# Patient Record
Sex: Male | Born: 1950 | Race: White | Hispanic: No | Marital: Married | Smoking: Never smoker
Health system: Southern US, Community
[De-identification: ages and names within clinical notes are randomized; demographics above are authoritative.]

## PROBLEM LIST (undated history)

## (undated) DIAGNOSIS — I10 Essential (primary) hypertension: Secondary | ICD-10-CM

## (undated) DIAGNOSIS — C73 Malignant neoplasm of thyroid gland: Secondary | ICD-10-CM

## (undated) DIAGNOSIS — K56609 Unspecified intestinal obstruction, unspecified as to partial versus complete obstruction: Secondary | ICD-10-CM

## (undated) DIAGNOSIS — E119 Type 2 diabetes mellitus without complications: Secondary | ICD-10-CM

## (undated) DIAGNOSIS — E7800 Pure hypercholesterolemia, unspecified: Secondary | ICD-10-CM

## (undated) DIAGNOSIS — Z87442 Personal history of urinary calculi: Secondary | ICD-10-CM

## (undated) DIAGNOSIS — E079 Disorder of thyroid, unspecified: Secondary | ICD-10-CM

## (undated) DIAGNOSIS — G4730 Sleep apnea, unspecified: Secondary | ICD-10-CM

## (undated) HISTORY — PX: THYROIDECTOMY: SHX17

## (undated) HISTORY — PX: LAPAROSCOPIC GASTRIC SLEEVE RESECTION: SHX5895

## (undated) HISTORY — DX: Type 2 diabetes mellitus without complications: E11.9

## (undated) HISTORY — PX: ABDOMINAL SURGERY: SHX537

## (undated) HISTORY — DX: Essential (primary) hypertension: I10

## (undated) HISTORY — PX: HERNIA REPAIR: SHX51

## (undated) HISTORY — DX: Disorder of thyroid, unspecified: E07.9

## (undated) HISTORY — DX: Pure hypercholesterolemia, unspecified: E78.00

## (undated) HISTORY — PX: COLON SURGERY: SHX602

---

## 1998-03-05 ENCOUNTER — Ambulatory Visit (HOSPITAL_COMMUNITY): Admission: RE | Admit: 1998-03-05 | Discharge: 1998-03-05 | Payer: Self-pay | Admitting: Internal Medicine

## 1999-04-24 ENCOUNTER — Inpatient Hospital Stay (HOSPITAL_COMMUNITY): Admission: EM | Admit: 1999-04-24 | Discharge: 1999-05-03 | Payer: Self-pay | Admitting: General Surgery

## 1999-04-30 ENCOUNTER — Encounter: Payer: Self-pay | Admitting: General Surgery

## 2000-01-19 ENCOUNTER — Ambulatory Visit (HOSPITAL_BASED_OUTPATIENT_CLINIC_OR_DEPARTMENT_OTHER): Admission: RE | Admit: 2000-01-19 | Discharge: 2000-01-19 | Payer: Self-pay | Admitting: General Surgery

## 2001-07-13 ENCOUNTER — Encounter: Payer: Self-pay | Admitting: Internal Medicine

## 2001-07-13 ENCOUNTER — Encounter: Admission: RE | Admit: 2001-07-13 | Discharge: 2001-07-13 | Payer: Self-pay | Admitting: Internal Medicine

## 2001-07-19 ENCOUNTER — Other Ambulatory Visit: Admission: RE | Admit: 2001-07-19 | Discharge: 2001-07-19 | Payer: Self-pay | Admitting: *Deleted

## 2001-09-01 ENCOUNTER — Encounter (INDEPENDENT_AMBULATORY_CARE_PROVIDER_SITE_OTHER): Payer: Self-pay | Admitting: *Deleted

## 2001-09-01 ENCOUNTER — Ambulatory Visit (HOSPITAL_BASED_OUTPATIENT_CLINIC_OR_DEPARTMENT_OTHER): Admission: RE | Admit: 2001-09-01 | Discharge: 2001-09-01 | Payer: Self-pay | Admitting: *Deleted

## 2001-09-11 ENCOUNTER — Encounter: Payer: Self-pay | Admitting: Otolaryngology

## 2001-09-13 ENCOUNTER — Encounter (INDEPENDENT_AMBULATORY_CARE_PROVIDER_SITE_OTHER): Payer: Self-pay | Admitting: Specialist

## 2001-09-13 ENCOUNTER — Inpatient Hospital Stay (HOSPITAL_COMMUNITY): Admission: RE | Admit: 2001-09-13 | Discharge: 2001-09-17 | Payer: Self-pay | Admitting: Otolaryngology

## 2001-10-04 ENCOUNTER — Encounter: Payer: Self-pay | Admitting: Hematology and Oncology

## 2001-10-04 ENCOUNTER — Ambulatory Visit (HOSPITAL_COMMUNITY): Admission: RE | Admit: 2001-10-04 | Discharge: 2001-10-04 | Payer: Self-pay | Admitting: Hematology and Oncology

## 2001-11-20 ENCOUNTER — Ambulatory Visit (HOSPITAL_COMMUNITY): Admission: RE | Admit: 2001-11-20 | Discharge: 2001-11-20 | Payer: Self-pay | Admitting: Endocrinology

## 2001-11-24 ENCOUNTER — Ambulatory Visit (HOSPITAL_COMMUNITY): Admission: RE | Admit: 2001-11-24 | Discharge: 2001-11-24 | Payer: Self-pay | Admitting: Endocrinology

## 2001-11-24 ENCOUNTER — Encounter: Payer: Self-pay | Admitting: Endocrinology

## 2001-12-21 ENCOUNTER — Encounter: Payer: Self-pay | Admitting: Endocrinology

## 2001-12-21 ENCOUNTER — Ambulatory Visit (HOSPITAL_COMMUNITY): Admission: RE | Admit: 2001-12-21 | Discharge: 2001-12-21 | Payer: Self-pay | Admitting: Endocrinology

## 2002-05-02 ENCOUNTER — Encounter: Payer: Self-pay | Admitting: Family Medicine

## 2002-05-02 ENCOUNTER — Encounter: Admission: RE | Admit: 2002-05-02 | Discharge: 2002-05-02 | Payer: Self-pay | Admitting: Family Medicine

## 2002-09-03 ENCOUNTER — Encounter (HOSPITAL_COMMUNITY): Admission: RE | Admit: 2002-09-03 | Discharge: 2002-12-02 | Payer: Self-pay | Admitting: Endocrinology

## 2002-09-07 ENCOUNTER — Encounter: Payer: Self-pay | Admitting: Endocrinology

## 2002-10-17 ENCOUNTER — Encounter: Payer: Self-pay | Admitting: Endocrinology

## 2003-02-12 ENCOUNTER — Encounter: Admission: RE | Admit: 2003-02-12 | Discharge: 2003-05-13 | Payer: Self-pay | Admitting: Endocrinology

## 2004-03-06 ENCOUNTER — Observation Stay (HOSPITAL_COMMUNITY): Admission: RE | Admit: 2004-03-06 | Discharge: 2004-03-07 | Payer: Self-pay | Admitting: General Surgery

## 2009-06-30 ENCOUNTER — Encounter (HOSPITAL_COMMUNITY): Admission: RE | Admit: 2009-06-30 | Discharge: 2009-08-14 | Payer: Self-pay | Admitting: Endocrinology

## 2009-07-08 ENCOUNTER — Encounter: Admission: RE | Admit: 2009-07-08 | Discharge: 2009-09-29 | Payer: Self-pay | Admitting: General Surgery

## 2009-07-08 ENCOUNTER — Ambulatory Visit (HOSPITAL_COMMUNITY): Admission: RE | Admit: 2009-07-08 | Discharge: 2009-07-08 | Payer: Self-pay | Admitting: General Surgery

## 2009-07-09 ENCOUNTER — Ambulatory Visit (HOSPITAL_COMMUNITY): Admission: RE | Admit: 2009-07-09 | Discharge: 2009-07-09 | Payer: Self-pay | Admitting: General Surgery

## 2009-07-16 ENCOUNTER — Ambulatory Visit (HOSPITAL_COMMUNITY): Admission: RE | Admit: 2009-07-16 | Discharge: 2009-07-16 | Payer: Self-pay | Admitting: General Surgery

## 2009-10-20 ENCOUNTER — Inpatient Hospital Stay (HOSPITAL_COMMUNITY): Admission: RE | Admit: 2009-10-20 | Discharge: 2009-10-21 | Payer: Self-pay | Admitting: General Surgery

## 2009-10-25 ENCOUNTER — Inpatient Hospital Stay (HOSPITAL_COMMUNITY): Admission: EM | Admit: 2009-10-25 | Discharge: 2009-10-26 | Payer: Self-pay | Admitting: Emergency Medicine

## 2009-11-17 ENCOUNTER — Ambulatory Visit (HOSPITAL_COMMUNITY): Admission: RE | Admit: 2009-11-17 | Discharge: 2009-11-17 | Payer: Self-pay | Admitting: General Surgery

## 2010-04-29 ENCOUNTER — Ambulatory Visit (HOSPITAL_COMMUNITY): Admission: RE | Admit: 2010-04-29 | Discharge: 2010-04-29 | Payer: Self-pay | Admitting: Advanced Practice Midwife

## 2010-04-29 ENCOUNTER — Encounter: Admission: RE | Admit: 2010-04-29 | Discharge: 2010-04-29 | Payer: Self-pay | Admitting: Family Medicine

## 2010-05-01 ENCOUNTER — Inpatient Hospital Stay (HOSPITAL_COMMUNITY): Admission: EM | Admit: 2010-05-01 | Discharge: 2010-05-04 | Payer: Self-pay | Admitting: Emergency Medicine

## 2010-12-07 ENCOUNTER — Encounter: Payer: Self-pay | Admitting: General Surgery

## 2011-02-01 LAB — CBC
HCT: 36 % — ABNORMAL LOW (ref 39.0–52.0)
HCT: 39.4 % (ref 39.0–52.0)
HCT: 43.4 % (ref 39.0–52.0)
HCT: 43.8 % (ref 39.0–52.0)
Hemoglobin: 12.6 g/dL — ABNORMAL LOW (ref 13.0–17.0)
Hemoglobin: 13.4 g/dL (ref 13.0–17.0)
Hemoglobin: 14.7 g/dL (ref 13.0–17.0)
MCHC: 33.6 g/dL (ref 30.0–36.0)
MCHC: 33.8 g/dL (ref 30.0–36.0)
MCHC: 33.9 g/dL (ref 30.0–36.0)
MCHC: 34.3 g/dL (ref 30.0–36.0)
MCV: 89.1 fL (ref 78.0–100.0)
MCV: 89.7 fL (ref 78.0–100.0)
MCV: 90.2 fL (ref 78.0–100.0)
Platelets: 193 10*3/uL (ref 150–400)
Platelets: 200 10*3/uL (ref 150–400)
Platelets: 236 10*3/uL (ref 150–400)
Platelets: 263 10*3/uL (ref 150–400)
RBC: 4.01 MIL/uL — ABNORMAL LOW (ref 4.22–5.81)
RBC: 4.81 MIL/uL (ref 4.22–5.81)
RDW: 14.3 % (ref 11.5–15.5)
WBC: 12.1 10*3/uL — ABNORMAL HIGH (ref 4.0–10.5)
WBC: 15.2 10*3/uL — ABNORMAL HIGH (ref 4.0–10.5)

## 2011-02-01 LAB — COMPREHENSIVE METABOLIC PANEL
BUN: 13 mg/dL (ref 6–23)
Calcium: 9.2 mg/dL (ref 8.4–10.5)
Creatinine, Ser: 1.05 mg/dL (ref 0.4–1.5)
Glucose, Bld: 148 mg/dL — ABNORMAL HIGH (ref 70–99)
Total Protein: 7.6 g/dL (ref 6.0–8.3)

## 2011-02-01 LAB — GLUCOSE, CAPILLARY
Glucose-Capillary: 115 mg/dL — ABNORMAL HIGH (ref 70–99)
Glucose-Capillary: 119 mg/dL — ABNORMAL HIGH (ref 70–99)
Glucose-Capillary: 126 mg/dL — ABNORMAL HIGH (ref 70–99)
Glucose-Capillary: 139 mg/dL — ABNORMAL HIGH (ref 70–99)
Glucose-Capillary: 143 mg/dL — ABNORMAL HIGH (ref 70–99)
Glucose-Capillary: 149 mg/dL — ABNORMAL HIGH (ref 70–99)
Glucose-Capillary: 184 mg/dL — ABNORMAL HIGH (ref 70–99)
Glucose-Capillary: 201 mg/dL — ABNORMAL HIGH (ref 70–99)
Glucose-Capillary: 92 mg/dL (ref 70–99)
Glucose-Capillary: 93 mg/dL (ref 70–99)

## 2011-02-01 LAB — PROCALCITONIN

## 2011-02-01 LAB — BASIC METABOLIC PANEL
BUN: 9 mg/dL (ref 6–23)
CO2: 28 mEq/L (ref 19–32)
Calcium: 8.6 mg/dL (ref 8.4–10.5)
Creatinine, Ser: 0.99 mg/dL (ref 0.4–1.5)
GFR calc non Af Amer: >60 mL/min (ref >60)
GFR calc non Af Amer: >60 mL/min (ref >60)
Glucose, Bld: 81 mg/dL (ref 70–99)
Potassium: 3.5 mEq/L (ref 3.5–5.1)
Sodium: 137 mEq/L (ref 135–145)
Sodium: 138 mEq/L (ref 135–145)

## 2011-02-01 LAB — DIFFERENTIAL
Basophils Absolute: 0.2 10*3/uL — ABNORMAL HIGH (ref 0.0–0.1)
Eosinophils Relative: 0 % (ref 0–5)
Eosinophils Relative: 2 % (ref 0–5)
Lymphocytes Relative: 10 % — ABNORMAL LOW (ref 12–46)
Lymphocytes Relative: 13 % (ref 12–46)
Lymphocytes Relative: 7 % — ABNORMAL LOW (ref 12–46)
Lymphs Abs: 1.1 10*3/uL (ref 0.7–4.0)
Lymphs Abs: 1.4 10*3/uL (ref 0.7–4.0)
Lymphs Abs: 1.6 10*3/uL (ref 0.7–4.0)
Monocytes Absolute: 1.1 10*3/uL — ABNORMAL HIGH (ref 0.1–1.0)
Neutro Abs: 9.2 10*3/uL — ABNORMAL HIGH (ref 1.7–7.7)
Neutrophils Relative %: 82 % — ABNORMAL HIGH (ref 43–77)

## 2011-02-01 LAB — HEMOGLOBIN A1C: Hgb A1c MFr Bld: 7.3 % — ABNORMAL HIGH (ref <5.7)

## 2011-02-01 LAB — B. BURGDORFI ANTIBODIES

## 2011-02-01 LAB — CULTURE, BLOOD (ROUTINE X 2): Culture: NO GROWTH

## 2011-02-01 LAB — LIPID PANEL
Total CHOL/HDL Ratio: 4.5 RATIO
Triglycerides: 152 mg/dL — ABNORMAL HIGH (ref <150)
VLDL: 30 mg/dL (ref 0–40)

## 2011-02-01 LAB — ROCKY MTN SPOTTED FVR AB, IGM-BLOOD

## 2011-02-16 LAB — COMPREHENSIVE METABOLIC PANEL
ALT: 44 U/L (ref 0–53)
Albumin: 3.7 g/dL (ref 3.5–5.2)
Alkaline Phosphatase: 83 U/L (ref 39–117)
BUN: 18 mg/dL (ref 6–23)
Chloride: 99 mEq/L (ref 96–112)
Glucose, Bld: 200 mg/dL — ABNORMAL HIGH (ref 70–99)
Potassium: 5 mEq/L (ref 3.5–5.1)
Sodium: 135 mEq/L (ref 135–145)
Total Bilirubin: 1 mg/dL (ref 0.3–1.2)
Total Protein: 7.4 g/dL (ref 6.0–8.3)

## 2011-02-16 LAB — DIFFERENTIAL
Basophils Absolute: 0.1 10*3/uL (ref 0.0–0.1)
Basophils Relative: 1 % (ref 0–1)
Eosinophils Absolute: 0.4 10*3/uL (ref 0.0–0.7)
Monocytes Absolute: 1.1 10*3/uL — ABNORMAL HIGH (ref 0.1–1.0)
Monocytes Relative: 9 % (ref 3–12)
Neutro Abs: 8.3 10*3/uL — ABNORMAL HIGH (ref 1.7–7.7)
Neutrophils Relative %: 68 % (ref 43–77)

## 2011-02-16 LAB — CBC
HCT: 41.1 % (ref 39.0–52.0)
HCT: 46.7 % (ref 39.0–52.0)
Hemoglobin: 15.9 g/dL (ref 13.0–17.0)
Platelets: 291 10*3/uL (ref 150–400)
RBC: 5.21 MIL/uL (ref 4.22–5.81)
RDW: 13.2 % (ref 11.5–15.5)
RDW: 13.3 % (ref 11.5–15.5)
WBC: 12.3 10*3/uL — ABNORMAL HIGH (ref 4.0–10.5)

## 2011-02-16 LAB — GLUCOSE, CAPILLARY
Glucose-Capillary: 147 mg/dL — ABNORMAL HIGH (ref 70–99)
Glucose-Capillary: 169 mg/dL — ABNORMAL HIGH (ref 70–99)
Glucose-Capillary: 185 mg/dL — ABNORMAL HIGH (ref 70–99)

## 2011-02-16 LAB — BASIC METABOLIC PANEL
BUN: 16 mg/dL (ref 6–23)
Creatinine, Ser: 1.17 mg/dL (ref 0.4–1.5)
GFR calc non Af Amer: >60 mL/min (ref >60)
Glucose, Bld: 148 mg/dL — ABNORMAL HIGH (ref 70–99)
Potassium: 3.8 mEq/L (ref 3.5–5.1)

## 2011-02-17 LAB — CBC
HCT: 42.5 % (ref 39.0–52.0)
MCHC: 33.7 g/dL (ref 30.0–36.0)
MCV: 88.9 fL (ref 78.0–100.0)
Platelets: 273 10*3/uL (ref 150–400)
RBC: 4.77 MIL/uL (ref 4.22–5.81)
WBC: 6.1 10*3/uL (ref 4.0–10.5)

## 2011-02-17 LAB — COMPREHENSIVE METABOLIC PANEL
BUN: 14 mg/dL (ref 6–23)
CO2: 27 mEq/L (ref 19–32)
Chloride: 106 mEq/L (ref 96–112)
Creatinine, Ser: 0.96 mg/dL (ref 0.4–1.5)
GFR calc non Af Amer: >60 mL/min (ref >60)
Total Bilirubin: 0.5 mg/dL (ref 0.3–1.2)

## 2011-02-17 LAB — DIFFERENTIAL
Basophils Relative: 0 % (ref 0–1)
Eosinophils Absolute: 0.4 10*3/uL (ref 0.0–0.7)
Eosinophils Relative: 6 % — ABNORMAL HIGH (ref 0–5)
Lymphocytes Relative: 42 % (ref 12–46)
Monocytes Absolute: 0.4 10*3/uL (ref 0.1–1.0)
Neutro Abs: 2.8 10*3/uL (ref 1.7–7.7)

## 2011-04-02 NOTE — Op Note (Signed)
Dylan Reyes                          ACCOUNT NO.:  [id number]   MEDICAL RECORD NO.:  [id number]                   PATIENT TYPE:  AMB   LOCATION:  DAY                                  FACILITY:  [facility]   PHYSICIAN:  Anselm Pancoast. Zachery Dakins, M.D.          DATE OF BIRTH:  09-06-1951   DATE OF PROCEDURE:  03/06/2004  DATE OF DISCHARGE:                                 OPERATIVE REPORT   PREOPERATIVE DIAGNOSIS:  Recurrent incisional hernia.   OPERATION:  Repair of incisional hernia with mesh, a piece of Prolene 6 x 6  inches.   General anesthesia.  Open technique.   SURGEON:  Anselm Pancoast. Zachery Dakins, M.D.   ASSISTANT:  Nurse.   HISTORY:  Dylan Reyes is a 60 year old approximately 380 pound male who had  intestinal obstruction approximately 5-6 years ago and developed an  incisional hernia around the mid portion of his abdomen that I repaired.  About two years after surgery, I saw him back when he was getting a little  weakness superior to that but noted that he had a mass in his left thyroid  that was referred to the EMT people and ultimately to [facility] because of a  carcinoma of the left thyroid.  This recurred and was reoperated at [facility].  He appears to be adequately managed and suppressed at this time.  His  incisional hernia is kind of getting gradually larger.  Returned to me for  repair of that.  I recommended that we repair this with general anesthesia.  I hoped that I was going to put in a small piece of mesh under the fascia  sort of above where he has  the previous mesh, and preoperatively, he was  given 2 gm of Kefzol.   After induction of general anesthesia via the endotracheal tube, I marked  where the actual defect was and then sharp dissection down through the skin  and subcutaneous tissue, the top part of the old midline incision, and the  hernia sac was identified.  It appeared that the mesh that had been placed,  a 3 x 6 piece, kind of just above the umbilicus and  below.  It basically  separated its superior left side, and he had a hernia that was kind of  dissecting up over the mesh in one area and then above where the mesh  stopped in the other one.  I dissected the hernia sac down.  Fortunately, he  has a very generous omentum with no actual small bowel or anything up in the  area.  I freed this up circumferentially and elected to take about the top  half of the mesh where it had kind of rolled up a little bit out and then  freed everything circumferentially so a piece of 6 x 6 Prolene mesh could be  placed.  This was anchored with 4-0 sutures of 0 Prolene twice through the  abdominal wall  through stab incisions.  Then there are numerous stitches  taken deep in the posterior fascia muscle with interrupted 2-0 Prolene,  approximately 4-5 stitches on each side in addition to the corner stitch.  Then with this reclosed, I then repaired the actual muscle over it in a  regular 0 Prolene interrupted suture.  The incision was about 3 inches.  I  did place about 20 cc of Marcaine 0.5% with Adrenaline for immediate  postoperative pain.  The subcutaneous tissue was then kind of freed up so I  could close this with 3-0 Vicryl, and then the skin itself was closed with  staples.  Two abdominal binders were put together to go around him were  used, with him coughing, and everything was lying flat and he heard his  stitches pop.  Then after he was extubated, he quieted on down.  Hopefully,  we will be able to not have to put an NG tube or even a Foley and let  him be up on the side of the bed.  He will be on the CPAP that he uses at  night and kind of limit the liquids today and hopefully he can go with a  regular diet tomorrow.  He will be kept at least overnight, according to how  he is feeling.  Diet advanced accordingly.                                               Anselm Pancoast. Zachery Dakins, M.D.    WJW/MEDQ  D:  03/06/2004  T:  03/06/2004  Job:  [id number]

## 2011-04-02 NOTE — Discharge Summary (Signed)
[place]. [facility]  Patient:    Dylan Reyes, Dylan Reyes Visit Number: [id number] MRN: [id number]          Service Type: MED Location: [phone] Attending Physician:  Susy Frizzle Admit Date:  09/13/2001 Discharge Date: 09/17/2001   CC:         Luanna Cole. Lenord Fellers, M.D.  Anselm Pancoast. Zachery Dakins, M.D.   Discharge Summary  ADMISSION DIAGNOSIS:  Metastatic thyroid carcinoma.  DISCHARGE DIAGNOSIS:  Metastatic thyroid carcinoma, status post total thyroidectomy and right modified neck dissection.  SERVICE:  ENT  PRIMARY CARE PHYSICIAN:  Dr. Luanna Cole. [place].  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  He underwent these procedures without difficulty.  He was transferred to the intensive care unit for the initial postoperative 24 hours.   He did very well with a drain in place.  He was on a regular diet and was transferred to the general nursing floor on postoperative day two because there was a shortage of general beds.  His calcium maintained a normal value during hospitalization. Drain was removed on postoperative day four prior to discharge.  There were no signs of swelling, hematoma, difficulty swallowing, difficulty with speech or with breathing.  He was discharged to home in good condition; instructed to take Synthroid daily.  Follow up with me on Tuesday for staple removal and we will make arrangements for radioactive iodine thyroid scan and potential treatment on his follow up as an outpatient. Attending Physician:  Susy Frizzle DD:  09/17/01 TD:  09/18/01 Job: [id number]

## 2011-04-02 NOTE — Op Note (Signed)
[place]. [facility]  Patient:    DRAYLON, MERCADEL Visit Number: [id number] MRN: [id number]          Service Type: DSU Location: [facility] Attending Physician:  Aundria Mems Dictated by:   Kathy Breach, M.D. Proc. 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  OPERATIVE PROCEDURE:  Excision midline bilobular cervical cyst with isthmus tumor.  POSTOPERATIVE DIAGNOSIS:  Frozen section returns papillary carcinoma in the thyroid portion of the specimen.  DESCRIPTION OF PROCEDURE:  With the patient under general endotracheal anesthesia, the neck, upper chest, and lower face was prepped and draped in a sterile fashion.  The patient had readily palpable 4- to 5-cm, just to the left of the midline, firm mass previously aspirated as a cyst overlying the left thyroid alar area.  Had a secondary firm palpable midline mass at the cricoid cartilage level.  A 4- to 5-inch horizontal incision in skin creases was made just below the cricoid cartilage level at the lower margin of the palpable mass.  An incision was made through subcutaneous fat and medial margins platysmal muscle down onto the strap muscles.  The strap muscles were split vertically in the midline encountering mass overlying the cricoid area and a cystic extension about 1.5- x 2-cm inferiorly down the midline over the upper trachea.  Also present was a large 4- to 5-cm cystic mass extending to the left side of the midline overlying the thyroid alar area.  With strap muscles elevated, the soft tissues were dissected off of the superficial aspect of the bilobular and mid portion solid tumor mass.  The ______ mass dissected fairly readily being careful  around the superior margin and no extending tract was identified.  We dissected down to the thyroid isthmus area basically overlying the cricoid so assume perhaps above the isthmus of the true gland.  The cystic extension inferiorly over the midline trachea was elevated.  Dissection on the right side and the undersurface very fibrotic and sticky as was the dissection around the lobular firm solid mass in the isthmus area.  There seemed to be no direct connection to the right thyroid lobe. There was induration and firm irregular tissue in the margin of the right strap muscle.  The lesion definitely confluent with the medial aspect of the left thyroid lobe and was dissected sharp and cautery division between the medial left thyroid lobe which felt fairly smooth otherwise and the right thyroid lobe to palpation was smooth.  Tumor was very adherent onto the cricoid cartilage as well as the cricothyroid membrane and first upper tracheal ring being dissected by Bard-Parker sharp dissection to obtain freedom of the fibrotic tumor mass and cyst from the anterior upper tracheal cricoid wall.  With the mass removed, hemostasis was complete.  Prominent vessels clamped, ligated, and tied with 4-0 silk ties.  The entire dissection remained on the anterior 120 degrees of the trachea.  The wound was irrigated and specimens sent for frozen section.  The wound was closed with interrupted 4-0 chromic catgut subcutaneously and the vertical strap muscles reapproximated deep to this after a Jackson-Pratt drain was placed and brought out  at midline stab incision inferior to the incision line.  Skin margins were then approximated with a running 4-0 nylon suture.  Margins of the skin were painted with Benzoin and the incision covered with sterile Steri-Strip dressing.  Frozen section returned as probable papillary carcinoma, suspect arising in the thyroglossal duct cyst.  Because of the intimacy of the tumor to  the anterior tracheal wall, we felt that further resection would require wide field removal of strap muscles, both thyroid lobes and consider strongly a resection of the her trachea cricoid for adequate margins.  To pursue this, it was judged a secondary procedure after having discussed the situation with the patient and her family rather than considering any immediate proceedings at this time.  ESTIMATED BLOOD LOSS:  Less than 50 cc.  The patient tolerated the procedure well and was taken to the recovery room in stable general condition. Dictated by:   Kathy Breach, M.D. Attending Physician:  Aundria Mems DD:  09/01/01 TD:  09/03/01 Job: [id number]

## 2011-04-02 NOTE — Op Note (Signed)
[place]. [facility]  Patient:    Dylan Reyes, Dylan Reyes Visit Number: [id number] MRN: [id number]          Service Type: MED Location: MICU 2112 [id number] Attending Physician:  Susy Frizzle Dictated by:   Jeannett Senior Pollyann Kennedy, M.D. Proc. Date: 09/13/01 Admit Date:  09/13/2001   CC:         Anselm Pancoast. Zachery Dakins, M.D.   Operative Report  PREOPERATIVE DIAGNOSIS:  Papillary thyroid carcinoma with cervical metastases.  POSTOPERATIVE DIAGNOSIS:  Papillary thyroid carcinoma with cervical metastases.  PROCEDURE: 1. Total thyroidectomy with paratracheal node dissection. 2. Right modified neck dissection including levels 2, 3, and 4.  SURGEON: Jefry H. Pollyann Kennedy, M.D.  ASSISTANT:  Kathy Breach, M.D.  ANESTHESIA:  General endotracheal.  COMPLICATIONS:  None.  ESTIMATED BLOOD LOSS: 50 cc.  FINDINGS:  Diffusely infiltrating mass involving the left anterior and superior aspect of the thyroid with adhesion to the cricoid and cricothyroid membrane.  Multiple enlarged lymph nodes, right jugular chain.  Frozen section diagnosis revealed metastatic papillary carcinoma.  Also, multiple smaller enlarged paratracheal lymph nodes.  REFERRING PHYSICIAN:  Anselm Pancoast. Zachery Dakins, M.D.  HISTORY:  This is a 60 year old gentleman who underwent removal of a thyroglossal duct cyst about 2 weeks ago that was found to contain a focus of papillary thyroid carcinoma.  Preoperative needle biopsies did not reveal any findings to suggest this, and preoperative imaging studies did not reveal any evidence of a mass in the thyroid gland.  Decision was made to undergo a total thyroidectomy and to perform node sampling as preoperative imaging did reveal multiple enlarged nodes in the right jugular chain. The risks, benefits, alternatives, and complications of the procedure were explained to the patient and wife who seemed to understand and agreed to surgery.  PROCEDURE:  The patient was taken to the  operating room and placed on the operating table in the supine position.  Following induction of general endotracheal anesthesia, the neck was prepped and draped in a standard fashion.  The previous low collar incision was outlined and continued along the right side with an ascending limb up to the mastoid process area. Electrocautery was used to incise the skin, superficial fascia, and through the platysma muscle.  A subplatysmal flap was developed superiorly up to the angle of the mandible and the thyroid notch and inferiorly to the clavicle.  TOTAL THYROIDECTOMY:  The thyroid retractor was used to retract the skin flaps.  There was dense, fibrotic tissue around the previous surgical site. The sternocleidomastoid muscle was identified bilaterally and reflected laterally.  The strap muscles were difficult to identify, and they were felt to be contained within a lot of this fibrotic-type of tissue.  There was significant desmoplastic-type response surrounding the anterior aspect of the thyroid gland diffusely and around the strap muscles.  Part of the straps were reflected off laterally.  The remainder were kept with the specimen.  The capsule of the thyroid gland was identified bilaterally. The superior pole was brought down by carefully ligating the vascular attachments between clamps. The dissection was right on the capsule at both sides; however, just superior to the superior pole on the left side, which is where the original tumor was dissected out, I could not be sure that the superior laryngeal nerve was preserved.  It was not distinctly visualized.  The middle thyroid vein was ligated on both sides as well.  The gland was brought forward on both sides. The recurrent laryngeal nerve was  identified on both sides and kept unmolested.  Berrys ligament was carefully dissected off the face of the trachea.  There was significant adhesion to superior aspect of the gland to the superior  aspect of the gland to the cricothyroid membrane area. This area was sharply dissected with a scalpel.  There was an inferior parathyroid identified on the left side and preserved with its blood supply.  There were no parathyroids identified during the dissection of the gland on the right side.  The entire gland was mobilized.  The paratracheal node dissection was then accomplished by dissecting lymph node containing fibrofatty tissue from the pretracheal and upper mediastinal area.  Dissection was down as far as could safely accomplished with the visualization that was obtained.  There was soft tissue palpable that was posterior to the sternum.  Dissection was not attempted in this area.  There were no distinct nodes palpable down there. Dictated by:   Jeannett Senior Pollyann Kennedy, M.D. Attending Physician:  Susy Frizzle DD:  09/14/01 TD:  09/14/01 Job: [id number]

## 2016-04-21 ENCOUNTER — Emergency Department (HOSPITAL_BASED_OUTPATIENT_CLINIC_OR_DEPARTMENT_OTHER): Payer: BLUE CROSS/BLUE SHIELD

## 2016-04-21 ENCOUNTER — Encounter (HOSPITAL_BASED_OUTPATIENT_CLINIC_OR_DEPARTMENT_OTHER): Payer: Self-pay

## 2016-04-21 ENCOUNTER — Emergency Department (HOSPITAL_BASED_OUTPATIENT_CLINIC_OR_DEPARTMENT_OTHER)
Admission: EM | Admit: 2016-04-21 | Discharge: 2016-04-21 | Disposition: A | Discharge status: Home / Self Care | Payer: BLUE CROSS/BLUE SHIELD | Attending: Emergency Medicine | Admitting: Emergency Medicine

## 2016-04-21 DIAGNOSIS — Z85850 Personal history of malignant neoplasm of thyroid: Secondary | ICD-10-CM | POA: Diagnosis not present

## 2016-04-21 DIAGNOSIS — R1111 Vomiting without nausea: Secondary | ICD-10-CM | POA: Insufficient documentation

## 2016-04-21 DIAGNOSIS — Z79899 Other long term (current) drug therapy: Secondary | ICD-10-CM | POA: Diagnosis not present

## 2016-04-21 DIAGNOSIS — R079 Chest pain, unspecified: Secondary | ICD-10-CM | POA: Diagnosis not present

## 2016-04-21 DIAGNOSIS — I10 Essential (primary) hypertension: Secondary | ICD-10-CM | POA: Insufficient documentation

## 2016-04-21 DIAGNOSIS — Z7982 Long term (current) use of aspirin: Secondary | ICD-10-CM | POA: Insufficient documentation

## 2016-04-21 DIAGNOSIS — E119 Type 2 diabetes mellitus without complications: Secondary | ICD-10-CM | POA: Insufficient documentation

## 2016-04-21 HISTORY — DX: Malignant neoplasm of thyroid gland: C73

## 2016-04-21 HISTORY — DX: Unspecified intestinal obstruction, unspecified as to partial versus complete obstruction: K56.609

## 2016-04-21 LAB — TROPONIN I: Troponin I: <0.03 ng/mL (ref <0.031)

## 2016-04-21 LAB — BASIC METABOLIC PANEL
Anion gap: 9 (ref 5–15)
BUN: 14 mg/dL (ref 6–20)
CALCIUM: 8.8 mg/dL — AB (ref 8.9–10.3)
CO2: 26 mmol/L (ref 22–32)
Chloride: 99 mmol/L — ABNORMAL LOW (ref 101–111)
Creatinine, Ser: 0.96 mg/dL (ref 0.61–1.24)
GFR calc Af Amer: >60 mL/min (ref >60)
GLUCOSE: 229 mg/dL — AB (ref 65–99)
Potassium: 3.8 mmol/L (ref 3.5–5.1)
Sodium: 134 mmol/L — ABNORMAL LOW (ref 135–145)

## 2016-04-21 LAB — CBC
HEMATOCRIT: 44.2 % (ref 39.0–52.0)
Hemoglobin: 15.2 g/dL (ref 13.0–17.0)
MCH: 31.7 pg (ref 26.0–34.0)
MCHC: 34.4 g/dL (ref 30.0–36.0)
MCV: 92.3 fL (ref 78.0–100.0)
Platelets: 238 10*3/uL (ref 150–400)
RBC: 4.79 MIL/uL (ref 4.22–5.81)
RDW: 12.6 % (ref 11.5–15.5)
WBC: 10.4 10*3/uL (ref 4.0–10.5)

## 2016-04-21 MED ORDER — ASPIRIN 81 MG PO CHEW
CHEWABLE_TABLET | ORAL | Status: AC
  Filled 2016-04-21: qty 3

## 2016-04-21 MED ORDER — ASPIRIN 81 MG PO CHEW
243.0000 mg | CHEWABLE_TABLET | Freq: Once | ORAL | Status: AC
  Administered 2016-04-21: 243 mg via ORAL

## 2016-04-21 NOTE — ED Provider Notes (Signed)
CSN: [id number]     Arrival date & time 04/21/16  1125 History   First MD Initiated Contact with Patient 04/21/16 1155     Chief Complaint  Patient presents with  . Chest Pain   HPI FREDIRICK Reyes is a 65 y.o. male  presenting with right sided chest pain. He says that the pain started suddenly on the right side of his chest under his breast, it felt like a pressure. The pain lasted about 2.5 hours. Nothing he did made it worse or better. He had one episode of vomiting about 15 minutes after the episode started; no blood, just food contents. He denies increased reflux, ate a typical breakfast this mroning of grits and tenderloin, had a cup of coffee at 6am. Pain is currently completely gone. He has never had any chest pains in the past. He reports being physically active by gardening. He denies smoking, but does drink 2oz of gin a night. He has a significant family history of heart attacks, says his father and grandfather died in age 64s and an uncle had an MI in his 19s.    (Consider location/radiation/quality/duration/timing/severity/associated sxs/prior Treatment) Patient is a 65 y.o. male presenting with chest pain. The history is provided by the patient.  Chest Pain Pain location:  R chest Pain quality: pressure   Pain radiates to:  Does not radiate Pain radiates to the back: no   Onset quality:  Sudden Duration:  3 hours Timing:  Constant Progression:  Resolved Chronicity:  New Context: at rest   Context: not breathing, not eating and no movement   Relieved by:  Nothing Worsened by:  Nothing tried Ineffective treatments:  None tried Associated symptoms: vomiting   Associated symptoms: no abdominal pain, no back pain, no dizziness, no fever, no heartburn, no nausea, no palpitations and no shortness of breath   Risk factors: high cholesterol, hypertension and male sex     Past Medical History  Diagnosis Date  . Hypertension   . Hypercholesteremia   . Diabetes ([place])     Dr.  Chalmers Cater  . Thyroid disease     thyroid cancer 09/02 with radioactive iodine 7/06   . Thyroid cancer ([place])   . Bowel obstruction [facility])    Past Surgical History  Procedure Laterality Date  . Thyroidectomy    . Colon surgery    . Hernia repair    . Abdominal surgery    . Laparoscopic gastric sleeve resection     No family history on file. Social History  Substance Use Topics  . Smoking status: Never Smoker   . Smokeless tobacco: None  . Alcohol Use: Yes     Comment: daily    Review of Systems  Constitutional: Negative for fever.  HENT: Negative for rhinorrhea and sore throat.   Respiratory: Negative for shortness of breath.   Cardiovascular: Positive for chest pain. Negative for palpitations.  Gastrointestinal: Positive for vomiting. Negative for heartburn, nausea, abdominal pain and blood in stool.  Musculoskeletal: Negative for back pain.  Neurological: Negative for dizziness and light-headedness.  All other systems reviewed and are negative.     Allergies  Ciprofloxacin  Home Medications   Prior to Admission medications   Medication Sig Start Date End Date Taking? Authorizing Provider  hydrochlorothiazide (MICROZIDE) 12.5 MG capsule Take 12.5 mg by mouth daily.   Yes Historical Provider, MD  aspirin 81 MG tablet Take 81 mg by mouth daily.    Historical Provider, MD  levothyroxine (SYNTHROID, LEVOTHROID) 200  MCG tablet Take 200 mcg by mouth daily before breakfast.    Historical Provider, MD  lisinopril (PRINIVIL,ZESTRIL) 20 MG tablet Take 20 mg by mouth daily.    Historical Provider, MD  Multiple Vitamin (MULTIVITAMIN) capsule Take 1 capsule by mouth daily.    Historical Provider, MD  Omega-3 Fatty Acids (FISH OIL) 1000 MG CAPS Take by mouth.    Historical Provider, MD  simvastatin (ZOCOR) 40 MG tablet Take 40 mg by mouth daily.    Historical Provider, MD   BP 129/72 mmHg  Pulse 69  Temp(Src) 98.2 F (36.8 C) (Oral)  Resp 20  Ht 5\' 10"  (1.778 m)  Wt 108.863 kg   BMI 34.44 kg/m2  SpO2 99% Physical Exam  Constitutional: He is oriented to person, place, and time. He appears well-developed and well-nourished. No distress.  HENT:  Head: Normocephalic and atraumatic.  Eyes: Conjunctivae and EOM are normal. Pupils are equal, round, and reactive to light.  Neck: Normal range of motion. Neck supple.  Cardiovascular: Normal rate, regular rhythm, normal heart sounds and intact distal pulses.  Exam reveals no gallop and no friction rub.   No murmur heard. Pulmonary/Chest: Effort normal and breath sounds normal.    Abdominal: Soft. Bowel sounds are normal. He exhibits mass (large incisional hernia). He exhibits no distension. There is no tenderness. There is no rebound, no guarding and negative Murphy's sign.  Neurological: He is alert and oriented to person, place, and time.  Skin: Skin is warm and dry.  Psychiatric: He has a normal mood and affect. Thought content normal.  Nursing note and vitals reviewed.   ED Course  Procedures (including critical care time) Labs Review Labs Reviewed  BASIC METABOLIC PANEL - Abnormal; Notable for the following:    Sodium 134 (*)    Chloride 99 (*)    Glucose, Bld 229 (*)    Calcium 8.8 (*)    All other components within normal limits  CBC  TROPONIN I    Imaging Review No results found. I have personally reviewed and evaluated these images and lab results as part of my medical decision-making.   EKG Interpretation None      MDM   Final diagnoses:  Right-sided chest pain    Chest pain resolved prior to arrival. EKG showed no STEMI, nonspecific ST abnormality V3 appears to be early repol. Initial troponin negative. HEART score 3 (primarily risk factors - FH, HTN, HLD, obesity). Discussed staying and observation with second troponin, however patient declines this. Discussed follow up in 1-3 days with PCP/cardiology for determination of need for stress test.  Leone Brand, MD 04/21/16 [place], DO 04/21/16 1331

## 2016-04-21 NOTE — ED Notes (Signed)
C/o CP started 8am today while seated at computer-denies at present-present to triage in w/c-NAD-states EMS came to the office-refused transport

## 2016-04-21 NOTE — Discharge Instructions (Signed)
The pain you experience is not as likely to be your heart, but cannot be fully ruled out. Please see your regular doctor in the next 1-3 days to discuss referral to a cardiologist, or if you are able schedule an appointment with your previous cardiologist  For the next few days take a zantac or other heartburn medicine (prilosec) twice a day.   Continue a daily baby aspirin.

## 2016-04-22 ENCOUNTER — Encounter (HOSPITAL_BASED_OUTPATIENT_CLINIC_OR_DEPARTMENT_OTHER): Payer: Self-pay

## 2016-04-22 ENCOUNTER — Emergency Department (HOSPITAL_BASED_OUTPATIENT_CLINIC_OR_DEPARTMENT_OTHER)
Admission: EM | Admit: 2016-04-22 | Discharge: 2016-04-22 | Disposition: A | Discharge status: Home / Self Care | Payer: BLUE CROSS/BLUE SHIELD | Attending: Emergency Medicine | Admitting: Emergency Medicine

## 2016-04-22 DIAGNOSIS — N390 Urinary tract infection, site not specified: Secondary | ICD-10-CM | POA: Diagnosis not present

## 2016-04-22 DIAGNOSIS — E119 Type 2 diabetes mellitus without complications: Secondary | ICD-10-CM | POA: Diagnosis not present

## 2016-04-22 DIAGNOSIS — I10 Essential (primary) hypertension: Secondary | ICD-10-CM | POA: Insufficient documentation

## 2016-04-22 DIAGNOSIS — R319 Hematuria, unspecified: Secondary | ICD-10-CM | POA: Diagnosis not present

## 2016-04-22 DIAGNOSIS — R509 Fever, unspecified: Secondary | ICD-10-CM | POA: Diagnosis present

## 2016-04-22 LAB — URINE MICROSCOPIC-ADD ON

## 2016-04-22 LAB — CBC WITH DIFFERENTIAL/PLATELET
Basophils Absolute: 0 10*3/uL (ref 0.0–0.1)
Basophils Relative: 0 %
EOS ABS: 0.1 10*3/uL (ref 0.0–0.7)
EOS PCT: 1 %
HCT: 45.2 % (ref 39.0–52.0)
Hemoglobin: 15.5 g/dL (ref 13.0–17.0)
LYMPHS ABS: 0.6 10*3/uL — AB (ref 0.7–4.0)
LYMPHS PCT: 9 %
MCH: 31.8 pg (ref 26.0–34.0)
MCHC: 34.3 g/dL (ref 30.0–36.0)
MCV: 92.6 fL (ref 78.0–100.0)
MONO ABS: 0.4 10*3/uL (ref 0.1–1.0)
MONOS PCT: 6 %
Neutro Abs: 5.7 10*3/uL (ref 1.7–7.7)
Neutrophils Relative %: 84 %
PLATELETS: 183 10*3/uL (ref 150–400)
RBC: 4.88 MIL/uL (ref 4.22–5.81)
RDW: 12.8 % (ref 11.5–15.5)
WBC: 6.7 10*3/uL (ref 4.0–10.5)

## 2016-04-22 LAB — URINALYSIS, ROUTINE W REFLEX MICROSCOPIC
Glucose, UA: >1000 mg/dL — AB
Hgb urine dipstick: NEGATIVE
KETONES UR: 15 mg/dL — AB
LEUKOCYTES UA: NEGATIVE
NITRITE: NEGATIVE
PROTEIN: 30 mg/dL — AB
Specific Gravity, Urine: 1.027 (ref 1.005–1.030)
pH: 6 (ref 5.0–8.0)

## 2016-04-22 LAB — I-STAT CG4 LACTIC ACID, ED
Lactic Acid, Venous: 2.28 mmol/L (ref 0.5–2.0)
Lactic Acid, Venous: 1.47 mmol/L (ref 0.5–2.0)

## 2016-04-22 LAB — BASIC METABOLIC PANEL
Anion gap: 9 (ref 5–15)
BUN: 10 mg/dL (ref 6–20)
CHLORIDE: 101 mmol/L (ref 101–111)
CO2: 22 mmol/L (ref 22–32)
CREATININE: 0.8 mg/dL (ref 0.61–1.24)
Calcium: 8.3 mg/dL — ABNORMAL LOW (ref 8.9–10.3)
GFR calc non Af Amer: >60 mL/min (ref >60)
GLUCOSE: 147 mg/dL — AB (ref 65–99)
Potassium: 3.1 mmol/L — ABNORMAL LOW (ref 3.5–5.1)
Sodium: 132 mmol/L — ABNORMAL LOW (ref 135–145)

## 2016-04-22 MED ORDER — SULFAMETHOXAZOLE-TRIMETHOPRIM 400-80 MG PO TABS: 1.0000 | ORAL_TABLET | Freq: Two times a day (BID) | ORAL | Status: DC

## 2016-04-22 MED ORDER — DEXTROSE 5 % IV SOLN
1.0000 g | Freq: Once | INTRAVENOUS | Status: AC
  Administered 2016-04-22: 1 g via INTRAVENOUS
  Filled 2016-04-22: qty 10

## 2016-04-22 MED ORDER — SODIUM CHLORIDE 0.9 % IV BOLUS (SEPSIS)
1000.0000 mL | Freq: Once | INTRAVENOUS | Status: AC
  Administered 2016-04-22: 1000 mL via INTRAVENOUS

## 2016-04-22 MED ORDER — SULFAMETHOXAZOLE-TRIMETHOPRIM 800-160 MG PO TABS: 1.0000 | ORAL_TABLET | Freq: Two times a day (BID) | ORAL | Status: DC

## 2016-04-22 MED ORDER — ACETAMINOPHEN 325 MG PO TABS
650.0000 mg | ORAL_TABLET | Freq: Once | ORAL | Status: AC
  Administered 2016-04-22: 650 mg via ORAL
  Filled 2016-04-22: qty 2

## 2016-04-22 MED ORDER — IBUPROFEN 800 MG PO TABS
800.0000 mg | ORAL_TABLET | Freq: Once | ORAL | Status: AC
  Administered 2016-04-22: 800 mg via ORAL
  Filled 2016-04-22: qty 1

## 2016-04-22 NOTE — ED Notes (Signed)
Pt reports fever started last night-blood in urine today-NAD-steady gait-denies pain

## 2016-04-22 NOTE — ED Provider Notes (Signed)
CSN: [id number]     Arrival date & time 04/22/16  1806 History  By signing my name below, I, Gwenlyn Fudge, attest that this documentation has been prepared under the direction and in the presence of Tanna Furry, MD. Electronically Signed: Gwenlyn Fudge, ED Scribe. 04/22/2016. 7:49 PM.   Chief Complaint  Patient presents with  . Hematuria    The history is provided by the patient. No language interpreter was used.   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 Pt also reports dark "golden" urine and one episode of hematuria with his current symptoms. He states he has been taking Tylenol with temporary relief of fever. Pt was seen yesterday for chest pain, diaphoresis and right arm numbness which has since completely resolved.  Pt reports hx of kidney infection 10 years ago.  Pt denies difficulty urinating, dysuria, decreased urine volume, pain in the lower abdomen, sore throat, rash or headache.   Past Medical History  Diagnosis Date  . Hypertension   . Hypercholesteremia   . Diabetes ([place])     Dr. Chalmers Cater  . Thyroid disease     thyroid cancer 09/02 with radioactive iodine 7/06   . Thyroid cancer ([place])   . Bowel obstruction [facility])    Past Surgical History  Procedure Laterality Date  . Thyroidectomy    . Colon surgery    . Hernia repair    . Abdominal surgery    . Laparoscopic gastric sleeve resection     No family history on file. Social History  Substance Use Topics  . Smoking status: Never Smoker   . Smokeless tobacco: None  . Alcohol Use: Yes     Comment: daily    Review of Systems  Constitutional: Positive for fever and chills. Negative for diaphoresis, appetite change and fatigue.  HENT: Negative for mouth sores, sore throat and trouble swallowing.   Eyes: Negative for visual disturbance.  Respiratory: Negative for cough, chest  tightness, shortness of breath and wheezing.   Cardiovascular: Negative for chest pain.  Gastrointestinal: Negative for nausea, vomiting, abdominal pain, diarrhea and abdominal distention.  Endocrine: Negative for polydipsia, polyphagia and polyuria.  Genitourinary: Positive for hematuria. Negative for dysuria, frequency, decreased urine volume and difficulty urinating.  Musculoskeletal: Positive for myalgias (generalized). Negative for gait problem.  Skin: Negative for color change, pallor and rash.  Neurological: Negative for dizziness, syncope, light-headedness and headaches.  Hematological: Does not bruise/bleed easily.  Psychiatric/Behavioral: Negative for behavioral problems and confusion.      Allergies  Ciprofloxacin  Home Medications   Prior to Admission medications   Medication Sig Start Date End Date Taking? Authorizing Provider  aspirin 81 MG tablet Take 81 mg by mouth daily.    Historical Provider, MD  hydrochlorothiazide (MICROZIDE) 12.5 MG capsule Take 12.5 mg by mouth daily.    Historical Provider, MD  levothyroxine (SYNTHROID, LEVOTHROID) 200 MCG tablet Take 200 mcg by mouth daily before breakfast.    Historical Provider, MD  lisinopril (PRINIVIL,ZESTRIL) 20 MG tablet Take 20 mg by mouth daily.    Historical Provider, MD  Multiple Vitamin (MULTIVITAMIN) capsule Take 1 capsule by mouth daily.    Historical Provider, MD  Omega-3 Fatty Acids (FISH OIL) 1000 MG CAPS Take by mouth.    Historical Provider, MD  simvastatin (ZOCOR) 40 MG tablet Take 40 mg by mouth daily.    Historical Provider,  MD  sulfamethoxazole-trimethoprim (BACTRIM DS,SEPTRA DS) 800-160 MG tablet Take 1 tablet by mouth 2 (two) times daily. 04/22/16   Tanna Furry, MD   BP 125/77 mmHg  Pulse 76  Temp(Src) 101.1 F (38.4 C) (Oral)  Resp 18  Ht 5\' 10"  (1.778 m)  Wt 241 lb (109.317 kg)  BMI 34.58 kg/m2  SpO2 95% Physical Exam  Constitutional: He is oriented to person, place, and time. He appears  well-developed and well-nourished. No distress.  Temperature: 103  HENT:  Head: Normocephalic.  Eyes: Conjunctivae are normal. Pupils are equal, round, and reactive to light. No scleral icterus.  Neck: Normal range of motion. Neck supple. No thyromegaly present.  Cardiovascular: Normal rate and regular rhythm.  Exam reveals no gallop and no friction rub.   No murmur heard. Pulmonary/Chest: Effort normal and breath sounds normal. No respiratory distress. He has no wheezes. He has no rales.  Abdominal: Soft. Bowel sounds are normal. He exhibits no distension. There is no tenderness. There is no rebound.  Musculoskeletal: Normal range of motion.  Neurological: He is alert and oriented to person, place, and time.  Skin: Skin is warm and dry. No rash noted.  Psychiatric: He has a normal mood and affect. His behavior is normal.  Nursing note and vitals reviewed.   ED Course  Procedures (including critical care time) DIAGNOSTIC STUDIES: Oxygen Saturation is 100% on RA, normal by my interpretation.    COORDINATION OF CARE: 7:33 PM Discussed treatment plan with pt at bedside which includes lab work and pt agreed to plan.   Labs Review Labs Reviewed  URINALYSIS, ROUTINE W REFLEX MICROSCOPIC (NOT AT [facility]) - Abnormal; Notable for the following:    Color, Urine AMBER (*)    Glucose, UA >1000 (*)    Bilirubin Urine SMALL (*)    Ketones, ur 15 (*)    Protein, ur 30 (*)    All other components within normal limits  URINE MICROSCOPIC-ADD ON - Abnormal; Notable for the following:    Squamous Epithelial / LPF 0-5 (*)    Bacteria, UA RARE (*)    All other components within normal limits  CBC WITH DIFFERENTIAL/PLATELET - Abnormal; Notable for the following:    Lymphs Abs 0.6 (*)    All other components within normal limits  BASIC METABOLIC PANEL - Abnormal; Notable for the following:    Sodium 132 (*)    Potassium 3.1 (*)    Glucose, Bld 147 (*)    Calcium 8.3 (*)    All other components  within normal limits  I-STAT CG4 LACTIC ACID, ED - Abnormal; Notable for the following:    Lactic Acid, Venous 2.28 (*)    All other components within normal limits  I-STAT CG4 LACTIC ACID, ED  I-STAT CG4 LACTIC ACID, ED    Imaging Review Dg Chest 2 View  04/21/2016  CLINICAL DATA:  65 year old male with chest pain, diaphoresis onset this morning. Initial encounter. EXAM: CHEST  2 VIEW COMPARISON:  Chest radiographs 07/09/2009. FINDINGS: Lung volumes remain normal. Normal cardiac size and mediastinal contours. Visualized tracheal air column is within normal limits. No pneumothorax or pulmonary edema. No pleural effusion or consolidation. Mild curvilinear opacity at the left lung base probably in the lingula most resembles scarring or atelectasis and is mildly increased since 2010. No acute osseous abnormality identified. IMPRESSION: Mild scarring or atelectasis in the lingula with no acute cardiopulmonary abnormality. Electronically Signed   By: Genevie Ann M.D.   On: 04/21/2016 12:40  I have personally reviewed and evaluated these images and lab results as part of my medical decision-making.   EKG Interpretation None      MDM   Final diagnoses:  UTI (lower urinary tract infection)     Patient feeling much improved. Lactate normalizes. Afebrile. States he feels "great". The preference would be alone. However he has had urticaria with Cipro. Plan Bactrim. Recheck culture on Monday with primary care. ER with acute changes.   Tanna Furry, MD 04/22/16 2255

## 2016-04-22 NOTE — Discharge Instructions (Signed)

## 2020-03-01 ENCOUNTER — Emergency Department (HOSPITAL_BASED_OUTPATIENT_CLINIC_OR_DEPARTMENT_OTHER): Payer: BC Managed Care – PPO

## 2020-03-01 ENCOUNTER — Encounter (HOSPITAL_BASED_OUTPATIENT_CLINIC_OR_DEPARTMENT_OTHER): Payer: Self-pay | Admitting: Emergency Medicine

## 2020-03-01 ENCOUNTER — Other Ambulatory Visit: Payer: Self-pay

## 2020-03-01 ENCOUNTER — Emergency Department (HOSPITAL_BASED_OUTPATIENT_CLINIC_OR_DEPARTMENT_OTHER)
Admission: EM | Admit: 2020-03-01 | Discharge: 2020-03-01 | Disposition: A | Discharge status: Home / Self Care | Payer: BC Managed Care – PPO | Attending: Emergency Medicine | Admitting: Emergency Medicine

## 2020-03-01 DIAGNOSIS — I10 Essential (primary) hypertension: Secondary | ICD-10-CM | POA: Insufficient documentation

## 2020-03-01 DIAGNOSIS — Z7984 Long term (current) use of oral hypoglycemic drugs: Secondary | ICD-10-CM | POA: Diagnosis not present

## 2020-03-01 DIAGNOSIS — Z7982 Long term (current) use of aspirin: Secondary | ICD-10-CM | POA: Insufficient documentation

## 2020-03-01 DIAGNOSIS — R319 Hematuria, unspecified: Secondary | ICD-10-CM | POA: Diagnosis not present

## 2020-03-01 DIAGNOSIS — E119 Type 2 diabetes mellitus without complications: Secondary | ICD-10-CM | POA: Diagnosis not present

## 2020-03-01 DIAGNOSIS — N200 Calculus of kidney: Secondary | ICD-10-CM | POA: Diagnosis not present

## 2020-03-01 DIAGNOSIS — E890 Postprocedural hypothyroidism: Secondary | ICD-10-CM | POA: Insufficient documentation

## 2020-03-01 DIAGNOSIS — Z79899 Other long term (current) drug therapy: Secondary | ICD-10-CM | POA: Insufficient documentation

## 2020-03-01 DIAGNOSIS — M5489 Other dorsalgia: Secondary | ICD-10-CM | POA: Diagnosis present

## 2020-03-01 LAB — CBC WITH DIFFERENTIAL/PLATELET
Abs Immature Granulocytes: 0.03 10*3/uL (ref 0.00–0.07)
Basophils Absolute: 0.1 10*3/uL (ref 0.0–0.1)
Basophils Relative: 1 %
Eosinophils Absolute: 0.2 10*3/uL (ref 0.0–0.5)
Eosinophils Relative: 3 %
HCT: 46 % (ref 39.0–52.0)
Hemoglobin: 15.6 g/dL (ref 13.0–17.0)
Immature Granulocytes: 0 %
Lymphocytes Relative: 19 %
Lymphs Abs: 1.5 10*3/uL (ref 0.7–4.0)
MCH: 32.2 pg (ref 26.0–34.0)
MCHC: 33.9 g/dL (ref 30.0–36.0)
MCV: 95 fL (ref 80.0–100.0)
Monocytes Absolute: 0.5 10*3/uL (ref 0.1–1.0)
Monocytes Relative: 6 %
Neutro Abs: 5.4 10*3/uL (ref 1.7–7.7)
Neutrophils Relative %: 71 %
Platelets: 218 10*3/uL (ref 150–400)
RBC: 4.84 MIL/uL (ref 4.22–5.81)
RDW: 12 % (ref 11.5–15.5)
WBC: 7.8 10*3/uL (ref 4.0–10.5)
nRBC: 0 % (ref 0.0–0.2)

## 2020-03-01 LAB — URINALYSIS, ROUTINE W REFLEX MICROSCOPIC

## 2020-03-01 LAB — HEPATIC FUNCTION PANEL
ALT: 49 U/L — ABNORMAL HIGH (ref 0–44)
AST: 47 U/L — ABNORMAL HIGH (ref 15–41)
Albumin: 4.2 g/dL (ref 3.5–5.0)
Alkaline Phosphatase: 68 U/L (ref 38–126)
Bilirubin, Direct: 0.1 mg/dL (ref 0.0–0.2)
Indirect Bilirubin: 0.8 mg/dL (ref 0.3–0.9)
Total Bilirubin: 0.9 mg/dL (ref 0.3–1.2)
Total Protein: 7.5 g/dL (ref 6.5–8.1)

## 2020-03-01 LAB — BASIC METABOLIC PANEL
Anion gap: 17 — ABNORMAL HIGH (ref 5–15)
BUN: 12 mg/dL (ref 8–23)
CO2: 24 mmol/L (ref 22–32)
Calcium: 9.3 mg/dL (ref 8.9–10.3)
Chloride: 96 mmol/L — ABNORMAL LOW (ref 98–111)
Creatinine, Ser: 1.04 mg/dL (ref 0.61–1.24)
GFR calc Af Amer: >60 mL/min (ref >60)
GFR calc non Af Amer: >60 mL/min (ref >60)
Glucose, Bld: 256 mg/dL — ABNORMAL HIGH (ref 70–99)
Potassium: 4.4 mmol/L (ref 3.5–5.1)
Sodium: 137 mmol/L (ref 135–145)

## 2020-03-01 LAB — URINALYSIS, MICROSCOPIC (REFLEX): RBC / HPF: >50 RBC/hpf (ref 0–5)

## 2020-03-01 LAB — LIPASE, BLOOD: Lipase: 32 U/L (ref 11–51)

## 2020-03-01 MED ORDER — ONDANSETRON HCL 4 MG PO TABS: 4.0000 mg | ORAL_TABLET | Freq: Four times a day (QID) | ORAL | 0 refills | Status: DC

## 2020-03-01 MED ORDER — TAMSULOSIN HCL 0.4 MG PO CAPS: 0.4000 mg | ORAL_CAPSULE | Freq: Every day | ORAL | 0 refills | Status: AC

## 2020-03-01 MED ORDER — MORPHINE SULFATE (PF) 4 MG/ML IV SOLN
4.0000 mg | Freq: Once | INTRAVENOUS | Status: AC
  Administered 2020-03-01: 4 mg via INTRAVENOUS
  Filled 2020-03-01: qty 1

## 2020-03-01 MED ORDER — OXYCODONE HCL 5 MG PO TABS: 5.0000 mg | ORAL_TABLET | Freq: Four times a day (QID) | ORAL | 0 refills | Status: DC | PRN

## 2020-03-01 MED ORDER — FENTANYL CITRATE (PF) 100 MCG/2ML IJ SOLN
50.0000 ug | Freq: Once | INTRAMUSCULAR | Status: AC
  Administered 2020-03-01: 08:00:00 50 ug via INTRAVENOUS
  Filled 2020-03-01: qty 2

## 2020-03-01 MED ORDER — CEPHALEXIN 500 MG PO CAPS: 500.0000 mg | ORAL_CAPSULE | Freq: Two times a day (BID) | ORAL | 0 refills | Status: AC

## 2020-03-01 NOTE — ED Provider Notes (Signed)
[facility] EMERGENCY DEPARTMENT Provider Note   CSN: [id number] Arrival date & time: 03/01/20  [id number]     History Chief Complaint  Patient presents with  . Back Pain  . Hematuria    Dylan Reyes is a 69 y.o. male.  Has had recent intermittent hematuria.  History of thyroid cancer.  Recently had a cystoscopy and was normal per patient.  Possibly still awaiting tissue sampling.  No known polyps of bladder.  Does have a remote history of kidney stones.  Having some right-sided flank pain today.  No pain with bowel movements.  Possibly some pain with urination.  The history is provided by the patient.  Hematuria This is a new problem. The current episode started 1 to 2 hours ago. The problem occurs rarely. The problem has not changed since onset.Pertinent negatives include no chest pain, no abdominal pain and no shortness of breath. Nothing aggravates the symptoms. Nothing relieves the symptoms. He has tried nothing for the symptoms. The treatment provided no relief.       Past Medical History:  Diagnosis Date  . Bowel obstruction ([place])   . Diabetes ([place])    Dr. Chalmers Cater  . Hypercholesteremia   . Hypertension   . Thyroid cancer ([place])   . Thyroid disease    thyroid cancer 09/02 with radioactive iodine 7/06     There are no problems to display for this patient.   Past Surgical History:  Procedure Laterality Date  . ABDOMINAL SURGERY    . COLON SURGERY    . HERNIA REPAIR    . LAPAROSCOPIC GASTRIC SLEEVE RESECTION    . THYROIDECTOMY         No family history on file.  Social History   Tobacco Use  . Smoking status: Never Smoker  . Smokeless tobacco: Never Used  Substance Use Topics  . Alcohol use: Yes    Comment: daily  . Drug use: No    Home Medications Prior to Admission medications   Medication Sig Start Date End Date Taking? Authorizing Provider  aspirin 81 MG tablet Take 81 mg by mouth daily.   Yes [provider]  co-enzyme Q-10 30  MG capsule Take 30 mg by mouth 3 (three) times daily.   Yes [provider]  hydrochlorothiazide (MICROZIDE) 12.5 MG capsule Take 12.5 mg by mouth daily.   Yes [provider]  levothyroxine (SYNTHROID, LEVOTHROID) 200 MCG tablet Take 200 mcg by mouth daily before breakfast.   Yes [provider]  lisinopril (PRINIVIL,ZESTRIL) 20 MG tablet Take 20 mg by mouth daily.   Yes [provider]  metFORMIN (GLUCOPHAGE) 1000 MG tablet Take 1,000 mg by mouth 2 (two) times daily with a meal.   Yes [provider]  cephALEXin (KEFLEX) 500 MG capsule Take 1 capsule (500 mg total) by mouth 2 (two) times daily for 7 days. 03/01/20 03/08/20  Lennice Sites, DO  Multiple Vitamin (MULTIVITAMIN) capsule Take 1 capsule by mouth daily.    [provider]  Omega-3 Fatty Acids (FISH OIL) 1000 MG CAPS Take by mouth.    [provider]  ondansetron (ZOFRAN) 4 MG tablet Take 1 tablet (4 mg total) by mouth every 6 (six) hours. 03/01/20   Dreyah Montrose, DO  oxyCODONE (ROXICODONE) 5 MG immediate release tablet Take 1 tablet (5 mg total) by mouth every 6 (six) hours as needed for up to 15 doses for severe pain. 03/01/20   Brentley Horrell, DO  simvastatin (ZOCOR) 40 MG  tablet Take 40 mg by mouth daily.    [provider]  sulfamethoxazole-trimethoprim (BACTRIM DS,SEPTRA DS) 800-160 MG tablet Take 1 tablet by mouth 2 (two) times daily. 04/22/16   Tanna Furry, MD  tamsulosin (FLOMAX) 0.4 MG CAPS capsule Take 1 capsule (0.4 mg total) by mouth daily for 5 doses. 03/01/20 03/06/20  Lennice Sites, DO    Allergies    Ciprofloxacin  Review of Systems   Review of Systems  Constitutional: Negative for chills and fever.  HENT: Negative for ear pain and sore throat.   Eyes: Negative for pain and visual disturbance.  Respiratory: Negative for cough and shortness of breath.   Cardiovascular: Negative for chest pain and palpitations.  Gastrointestinal: Negative for  abdominal pain and vomiting.  Genitourinary: Positive for flank pain and hematuria. Negative for decreased urine volume, discharge, dysuria, penile pain, penile swelling, scrotal swelling, testicular pain and urgency.  Musculoskeletal: Positive for back pain. Negative for arthralgias.  Skin: Negative for color change and rash.  Neurological: Negative for seizures and syncope.  All other systems reviewed and are negative.   Physical Exam Updated Vital Signs BP (!) 194/98   Pulse 70   Temp 98.2 F (36.8 C) (Oral)   Resp 18   Ht 5\' 9"  (1.753 m)   Wt 109.8 kg   SpO2 99%   BMI 35.74 kg/m   Physical Exam Vitals and nursing note reviewed.  Constitutional:      General: He is not in acute distress.    Appearance: He is well-developed. He is not ill-appearing.  HENT:     Head: Normocephalic and atraumatic.     Nose: Nose normal.     Mouth/Throat:     Mouth: Mucous membranes are moist.  Eyes:     Extraocular Movements: Extraocular movements intact.     Conjunctiva/sclera: Conjunctivae normal.     Pupils: Pupils are equal, round, and reactive to light.  Cardiovascular:     Rate and Rhythm: Normal rate and regular rhythm.     Pulses: Normal pulses.     Heart sounds: Normal heart sounds. No murmur.  Pulmonary:     Effort: Pulmonary effort is normal. No respiratory distress.     Breath sounds: Normal breath sounds.  Abdominal:     General: Abdomen is flat.     Palpations: Abdomen is soft. There is no mass.     Tenderness: There is no abdominal tenderness. There is right CVA tenderness.     Hernia: No hernia is present.  Musculoskeletal:        General: Normal range of motion.     Cervical back: Neck supple.  Skin:    General: Skin is warm and dry.     Capillary Refill: Capillary refill takes less than 2 seconds.  Neurological:     General: No focal deficit present.     Mental Status: He is alert.  Psychiatric:        Mood and Affect: Mood normal.     ED Results /  Procedures / Treatments   Labs (all labs ordered are listed, but only abnormal results are displayed) Labs Reviewed  URINALYSIS, ROUTINE W REFLEX MICROSCOPIC - Abnormal; Notable for the following components:      Result Value   Color, Urine BROWN (*)    APPearance TURBID (*)    Glucose, UA   (*)    Value: TEST NOT REPORTED DUE TO COLOR INTERFERENCE OF URINE PIGMENT   Hgb urine dipstick   (*)  Value: TEST NOT REPORTED DUE TO COLOR INTERFERENCE OF URINE PIGMENT   Bilirubin Urine   (*)    Value: TEST NOT REPORTED DUE TO COLOR INTERFERENCE OF URINE PIGMENT   Ketones, ur   (*)    Value: TEST NOT REPORTED DUE TO COLOR INTERFERENCE OF URINE PIGMENT   Protein, ur   (*)    Value: TEST NOT REPORTED DUE TO COLOR INTERFERENCE OF URINE PIGMENT   Nitrite   (*)    Value: TEST NOT REPORTED DUE TO COLOR INTERFERENCE OF URINE PIGMENT   Leukocytes,Ua   (*)    Value: TEST NOT REPORTED DUE TO COLOR INTERFERENCE OF URINE PIGMENT   All other components within normal limits  BASIC METABOLIC PANEL - Abnormal; Notable for the following components:   Chloride 96 (*)    Glucose, Bld 256 (*)    Anion gap 17 (*)    All other components within normal limits  HEPATIC FUNCTION PANEL - Abnormal; Notable for the following components:   AST 47 (*)    ALT 49 (*)    All other components within normal limits  URINALYSIS, MICROSCOPIC (REFLEX) - Abnormal; Notable for the following components:   Bacteria, UA MANY (*)    All other components within normal limits  CBC WITH DIFFERENTIAL/PLATELET  LIPASE, BLOOD    EKG None  Radiology CT Renal Stone Study  Result Date: 03/01/2020 CLINICAL DATA:  Right flank pain for 5 hours. Gross hematuria. Vomiting. EXAM: CT ABDOMEN AND PELVIS WITHOUT CONTRAST TECHNIQUE: Multidetector CT imaging of the abdomen and pelvis was performed following the standard protocol without IV contrast. COMPARISON:  10/26/2009 plain film. No prior CT. FINDINGS: Lower chest: Clear lung bases.  Normal heart size without pericardial or pleural effusion. Multivessel coronary artery atherosclerosis. Hepatobiliary: Moderate hepatic steatosis. Caudate and lateral segment left liver lobe enlargement. Small gallstones without acute cholecystitis or biliary duct dilatation. Pancreas: Normal, without mass or ductal dilatation. Spleen: Normal in size, without focal abnormality. Adrenals/Urinary Tract: Normal adrenal glands. Scarring in the lower pole left kidney. Lower pole right renal 2.4 cm low-density lesion is likely a cyst. Bilateral renal collecting system calculi, largest in the lower pole left kidney. Moderate right-sided hydroureteronephrosis to the level of a 2 mm distal right ureteric stone on 64/2 and 86 coronal. Hyperattenuation within the dependent bladder on 77/2. Stomach/Bowel: Surgical sutures within the stomach. Scattered colonic diverticula. Normal terminal ileum. Normal small bowel. 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 Vascular/Lymphatic: Aortic atherosclerosis. Prominent porta hepatis nodes up to 1.2 cm are likely related to steatosis. No pelvic sidewall adenopathy. Reproductive: Mild prostatomegaly. Other: No significant free fluid. Musculoskeletal: No acute osseous abnormality. IMPRESSION: 1. Moderate right-sided hydroureteronephrosis to the level of a distal right ureteric 2 mm stone. 2. Bilateral nephrolithiasis. 3. Hyperattenuation within the dependent bladder could represent tiny stones or a calcified plaque like lesion. Consider outpatient cystoscopy evaluation. 4. Hepatic steatosis and possible mild cirrhosis. Correlate with risk factors. 5. Coronary artery atherosclerosis. Aortic Atherosclerosis (ICD10-I70.0). Electronically Signed   By: Abigail Miyamoto M.D.   On: 03/01/2020 09:15    Procedures Procedures (including critical care time)  Medications Ordered in ED Medications  fentaNYL (SUBLIMAZE) injection 50  mcg (50 mcg Intravenous Given 03/01/20 0812)  morphine 4 MG/ML injection 4 mg (4 mg Intravenous Given 03/01/20 0854)    ED Course  I have reviewed the triage vital signs and the nursing notes.  Pertinent labs &  imaging results that were available during my care of the patient were reviewed by me and considered in my medical decision making (see chart for details).    MDM Rules/Calculators/A&P                      KINARD FATIMA is a 69 year old male with history of thyroid cancer who presents to the ED with hematuria, right flank pain.  Pain and symptoms started this morning.  Unremarkable vitals.  Normal temperature.  Patient with possible kidney stone versus hemorrhagic cystitis.  Patient recently had a cystoscopy that was he thought normal.  He has been having this issue intermittently for the last several weeks.  Is supposed to follow-up with oncologist for possible imaging.  Given his flank pain hematuria remote history of kidney stone will evaluate with CT scan of abdomen pelvis.  We will get basic labs and urinalysis.  Anticipate antibiotics.  Urine grossly bloody but no clots.  No retention symptoms.  Patient with 2 mm kidney stone.  Urinalysis overall difficult to evaluate for due to hematuria.  Will conservatively treat with Keflex and send urine culture.  No significant leukocytosis.  No fever.  Patient also with signs of fatty liver and told to follow-up with primary care doctor.  Patient already knows about plaque in his bladder as he already has cystoscopy.  Recommend follow-up with urology if not improved by Monday.  However given size of the stone believe this will pass with adequate pain medication, antiemetics, Flomax.  Patient with improved pain upon reevaluation.  Discharged in good condition.  Understands return precautions.  This chart was dictated using voice recognition software.  Despite best efforts to proofread,  errors can occur which can change the documentation meaning.    Final Clinical Impression(s) / ED Diagnoses Final diagnoses:  Kidney stone    Rx / DC Orders ED Discharge Orders         Ordered    oxyCODONE (ROXICODONE) 5 MG immediate release tablet  Every 6 hours PRN     03/01/20 0932    tamsulosin (FLOMAX) 0.4 MG CAPS capsule  Daily     03/01/20 0932    ondansetron (ZOFRAN) 4 MG tablet  Every 6 hours     03/01/20 0932    cephALEXin (KEFLEX) 500 MG capsule  2 times daily     03/01/20 0932           Lennice Sites, DO 03/01/20 0935

## 2020-03-01 NOTE — ED Notes (Signed)
[place] IV attempted x 2 without success. EDP at bedside to attempt IV

## 2020-03-01 NOTE — ED Triage Notes (Signed)
R low Back pain with hematuria this morning. Also reports he vomited x 1 this morning and noticed blood in the vomit.

## 2020-03-01 NOTE — ED Notes (Signed)
Patient transported to CT 

## 2020-03-01 NOTE — ED Notes (Signed)
IV attempted without success. 

## 2020-03-01 NOTE — ED Notes (Signed)
Pt has recently had intermittent hematuria since feb. He had a cystoscopy performed and is awaiting a phone call to schedule CT

## 2020-06-01 ENCOUNTER — Encounter (HOSPITAL_BASED_OUTPATIENT_CLINIC_OR_DEPARTMENT_OTHER): Payer: Self-pay | Admitting: Emergency Medicine

## 2020-06-01 ENCOUNTER — Emergency Department (HOSPITAL_BASED_OUTPATIENT_CLINIC_OR_DEPARTMENT_OTHER): Payer: BC Managed Care – PPO

## 2020-06-01 ENCOUNTER — Other Ambulatory Visit: Payer: Self-pay

## 2020-06-01 ENCOUNTER — Emergency Department (HOSPITAL_BASED_OUTPATIENT_CLINIC_OR_DEPARTMENT_OTHER)
Admission: EM | Admit: 2020-06-01 | Discharge: 2020-06-01 | Disposition: A | Discharge status: Home / Self Care | Payer: BC Managed Care – PPO | Attending: Emergency Medicine | Admitting: Emergency Medicine

## 2020-06-01 DIAGNOSIS — N200 Calculus of kidney: Secondary | ICD-10-CM | POA: Insufficient documentation

## 2020-06-01 DIAGNOSIS — I10 Essential (primary) hypertension: Secondary | ICD-10-CM | POA: Diagnosis not present

## 2020-06-01 DIAGNOSIS — R319 Hematuria, unspecified: Secondary | ICD-10-CM | POA: Insufficient documentation

## 2020-06-01 DIAGNOSIS — E119 Type 2 diabetes mellitus without complications: Secondary | ICD-10-CM | POA: Diagnosis not present

## 2020-06-01 DIAGNOSIS — C73 Malignant neoplasm of thyroid gland: Secondary | ICD-10-CM | POA: Insufficient documentation

## 2020-06-01 DIAGNOSIS — R109 Unspecified abdominal pain: Secondary | ICD-10-CM | POA: Diagnosis present

## 2020-06-01 LAB — CBC WITH DIFFERENTIAL/PLATELET
Abs Immature Granulocytes: 0.02 10*3/uL (ref 0.00–0.07)
Basophils Absolute: 0.1 10*3/uL (ref 0.0–0.1)
Basophils Relative: 1 %
Eosinophils Absolute: 0.3 10*3/uL (ref 0.0–0.5)
Eosinophils Relative: 4 %
HCT: 39.1 % (ref 39.0–52.0)
Hemoglobin: 13.3 g/dL (ref 13.0–17.0)
Immature Granulocytes: 0 %
Lymphocytes Relative: 22 %
Lymphs Abs: 1.5 10*3/uL (ref 0.7–4.0)
MCH: 32.1 pg (ref 26.0–34.0)
MCHC: 34 g/dL (ref 30.0–36.0)
MCV: 94.4 fL (ref 80.0–100.0)
Monocytes Absolute: 0.4 10*3/uL (ref 0.1–1.0)
Monocytes Relative: 6 %
Neutro Abs: 4.7 10*3/uL (ref 1.7–7.7)
Neutrophils Relative %: 67 %
Platelets: 231 10*3/uL (ref 150–400)
RBC: 4.14 MIL/uL — ABNORMAL LOW (ref 4.22–5.81)
RDW: 12.6 % (ref 11.5–15.5)
WBC: 7 10*3/uL (ref 4.0–10.5)
nRBC: 0 % (ref 0.0–0.2)

## 2020-06-01 LAB — URINALYSIS, ROUTINE W REFLEX MICROSCOPIC
Bilirubin Urine: NEGATIVE
Glucose, UA: 100 mg/dL — AB
Ketones, ur: NEGATIVE mg/dL
Leukocytes,Ua: NEGATIVE
Nitrite: NEGATIVE
Protein, ur: NEGATIVE mg/dL
Specific Gravity, Urine: 1.025 (ref 1.005–1.030)
pH: 5.5 (ref 5.0–8.0)

## 2020-06-01 LAB — COMPREHENSIVE METABOLIC PANEL
ALT: 29 U/L (ref 0–44)
AST: 35 U/L (ref 15–41)
Albumin: 3.9 g/dL (ref 3.5–5.0)
Alkaline Phosphatase: 57 U/L (ref 38–126)
Anion gap: 14 (ref 5–15)
BUN: 17 mg/dL (ref 8–23)
CO2: 22 mmol/L (ref 22–32)
Calcium: 8.5 mg/dL — ABNORMAL LOW (ref 8.9–10.3)
Chloride: 103 mmol/L (ref 98–111)
Creatinine, Ser: 1.05 mg/dL (ref 0.61–1.24)
GFR calc Af Amer: >60 mL/min (ref >60)
GFR calc non Af Amer: >60 mL/min (ref >60)
Glucose, Bld: 194 mg/dL — ABNORMAL HIGH (ref 70–99)
Potassium: 4 mmol/L (ref 3.5–5.1)
Sodium: 139 mmol/L (ref 135–145)
Total Bilirubin: 0.5 mg/dL (ref 0.3–1.2)
Total Protein: 6.8 g/dL (ref 6.5–8.1)

## 2020-06-01 LAB — URINALYSIS, MICROSCOPIC (REFLEX)

## 2020-06-01 LAB — LIPASE, BLOOD: Lipase: 40 U/L (ref 11–51)

## 2020-06-01 MED ORDER — TAMSULOSIN HCL 0.4 MG PO CAPS: 0.4000 mg | ORAL_CAPSULE | Freq: Every day | ORAL | 0 refills | Status: AC

## 2020-06-01 MED ORDER — ONDANSETRON HCL 4 MG PO TABS
4.0000 mg | ORAL_TABLET | Freq: Four times a day (QID) | ORAL | 0 refills | Status: AC
Start: 2020-06-01 — End: 2020-06-06

## 2020-06-01 MED ORDER — OXYCODONE HCL 5 MG PO TABS: 5.0000 mg | ORAL_TABLET | Freq: Four times a day (QID) | ORAL | 0 refills | Status: DC | PRN

## 2020-06-01 MED ORDER — HYDROMORPHONE HCL 1 MG/ML IJ SOLN
1.0000 mg | Freq: Once | INTRAMUSCULAR | Status: AC
  Administered 2020-06-01: 1 mg via INTRAVENOUS
  Filled 2020-06-01: qty 1

## 2020-06-01 MED ORDER — SODIUM CHLORIDE 0.9 % IV BOLUS
1000.0000 mL | Freq: Once | INTRAVENOUS | Status: AC
  Administered 2020-06-01: 1000 mL via INTRAVENOUS

## 2020-06-01 NOTE — ED Notes (Signed)
ED Provider at bedside. 

## 2020-06-01 NOTE — Discharge Instructions (Addendum)
Follow-up with urology as previously scheduled.  Return to the emergency department if symptoms worsen as discussed.

## 2020-06-01 NOTE — ED Notes (Signed)
Patient transported to CT 

## 2020-06-01 NOTE — ED Triage Notes (Addendum)
L flank pain since 4am with nausea. Denies dysuria and hematuria. Pt took a tramadol 1 hour ago and tylenol 3 hours ago.

## 2020-06-01 NOTE — ED Provider Notes (Signed)
[facility] Provider Note   CSN: [id number] Arrival date & time: 06/01/20  0915     History Chief Complaint  Patient presents with  . Flank Pain    Dylan Reyes is a 69 y.o. male.  History of kidney stones.  New left-sided flank pain for the last 2 days.  Recently diagnosed with urinary tract infection and has been on antibiotics.  Noticed some blood in his urine today.  The history is provided by the patient.  Flank Pain This is a recurrent problem. The current episode started yesterday. The problem occurs constantly. The problem has not changed since onset.Associated symptoms include abdominal pain (left flank pain). Pertinent negatives include no chest pain, no headaches and no shortness of breath. Nothing aggravates the symptoms. Nothing relieves the symptoms. He has tried nothing for the symptoms. The treatment provided no relief.       Past Medical History:  Diagnosis Date  . Bowel obstruction ([place])   . Diabetes ([place])    Dr. Chalmers Cater  . Hypercholesteremia   . Hypertension   . Thyroid cancer ([place])   . Thyroid disease    thyroid cancer 09/02 with radioactive iodine 7/06     There are no problems to display for this patient.   Past Surgical History:  Procedure Laterality Date  . ABDOMINAL SURGERY    . COLON SURGERY    . HERNIA REPAIR    . LAPAROSCOPIC GASTRIC SLEEVE RESECTION    . THYROIDECTOMY         No family history on file.  Social History   Tobacco Use  . Smoking status: Never Smoker  . Smokeless tobacco: Never Used  Substance Use Topics  . Alcohol use: Yes    Comment: daily  . Drug use: No    Home Medications Prior to Admission medications   Medication Sig Start Date End Date Taking? Authorizing Provider  aspirin 81 MG tablet Take 81 mg by mouth daily.    [provider]  co-enzyme Q-10 30 MG capsule Take 30 mg by mouth 3 (three) times daily.    [provider]  hydrochlorothiazide  (MICROZIDE) 12.5 MG capsule Take 12.5 mg by mouth daily.    [provider]  levothyroxine (SYNTHROID, LEVOTHROID) 200 MCG tablet Take 200 mcg by mouth daily before breakfast.    [provider]  lisinopril (PRINIVIL,ZESTRIL) 20 MG tablet Take 20 mg by mouth daily.    [provider]  metFORMIN (GLUCOPHAGE) 1000 MG tablet Take 1,000 mg by mouth 2 (two) times daily with a meal.    [provider]  Multiple Vitamin (MULTIVITAMIN) capsule Take 1 capsule by mouth daily.    [provider]  Omega-3 Fatty Acids (FISH OIL) 1000 MG CAPS Take by mouth.    [provider]  ondansetron (ZOFRAN) 4 MG tablet Take 1 tablet (4 mg total) by mouth every 6 (six) hours for 20 doses. 06/01/20 06/06/20  Barnard Sharps, DO  oxyCODONE (ROXICODONE) 5 MG immediate release tablet Take 1 tablet (5 mg total) by mouth every 6 (six) hours as needed for up to 20 doses for breakthrough pain. 06/01/20   Corgan Mormile, DO  simvastatin (ZOCOR) 40 MG tablet Take 40 mg by mouth daily.    [provider]  sulfamethoxazole-trimethoprim (BACTRIM DS,SEPTRA DS) 800-160 MG tablet Take 1 tablet by mouth 2 (two) times daily. 04/22/16   Tanna Furry, MD  tamsulosin (FLOMAX) 0.4 MG CAPS capsule Take 1 capsule (0.4 mg total)  by mouth daily for 10 days. 06/01/20 06/11/20  Lennice Sites, DO    Allergies    Ciprofloxacin  Review of Systems   Review of Systems  Constitutional: Negative for chills and fever.  HENT: Negative for ear pain and sore throat.   Eyes: Negative for pain and visual disturbance.  Respiratory: Negative for cough and shortness of breath.   Cardiovascular: Negative for chest pain and palpitations.  Gastrointestinal: Positive for abdominal pain (left flank pain). Negative for vomiting.  Genitourinary: Positive for flank pain and hematuria. Negative for dysuria.  Musculoskeletal: Negative for arthralgias and back pain.  Skin: Negative for color change and rash.    Neurological: Negative for seizures, syncope and headaches.  All other systems reviewed and are negative.   Physical Exam Updated Vital Signs  ED Triage Vitals  Enc Vitals Group     BP 06/01/20 0924 (!) 190/94     Pulse Rate 06/01/20 0924 70     Resp 06/01/20 0924 20     Temp 06/01/20 0924 98.2 F (36.8 C)     Temp Source 06/01/20 0924 Oral     SpO2 06/01/20 0924 99 %     Weight 06/01/20 0922 245 lb (111.1 kg)     Height 06/01/20 0922 5\' 9"  (1.753 m)     Head Circumference --      Peak Flow --      Pain Score 06/01/20 0922 7     Pain Loc --      Pain Edu? --      Excl. in [place]? --     Physical Exam Vitals and nursing note reviewed.  Constitutional:      General: He is not in acute distress.    Appearance: He is well-developed. He is not ill-appearing.  HENT:     Head: Normocephalic and atraumatic.     Nose: Nose normal.     Mouth/Throat:     Mouth: Mucous membranes are moist.  Eyes:     Extraocular Movements: Extraocular movements intact.     Conjunctiva/sclera: Conjunctivae normal.     Pupils: Pupils are equal, round, and reactive to light.  Cardiovascular:     Rate and Rhythm: Normal rate and regular rhythm.     Pulses: Normal pulses.     Heart sounds: Normal heart sounds. No murmur heard.   Pulmonary:     Effort: Pulmonary effort is normal. No respiratory distress.     Breath sounds: Normal breath sounds.  Abdominal:     General: Abdomen is flat.     Palpations: Abdomen is soft.     Tenderness: There is no abdominal tenderness. There is left CVA tenderness. There is no right CVA tenderness.  Musculoskeletal:     Cervical back: Normal range of motion and neck supple.  Skin:    General: Skin is warm and dry.     Capillary Refill: Capillary refill takes less than 2 seconds.  Neurological:     General: No focal deficit present.     Mental Status: He is alert.  Psychiatric:        Mood and Affect: Mood normal.     ED Results / Procedures / Treatments    Labs (all labs ordered are listed, but only abnormal results are displayed) Labs Reviewed  URINALYSIS, ROUTINE W REFLEX MICROSCOPIC - Abnormal; Notable for the following components:      Result Value   Glucose, UA 100 (*)    Hgb urine dipstick TRACE (*)  All other components within normal limits  CBC WITH DIFFERENTIAL/PLATELET - Abnormal; Notable for the following components:   RBC 4.14 (*)    All other components within normal limits  COMPREHENSIVE METABOLIC PANEL - Abnormal; Notable for the following components:   Glucose, Bld 194 (*)    Calcium 8.5 (*)    All other components within normal limits  URINALYSIS, MICROSCOPIC (REFLEX) - Abnormal; Notable for the following components:   Bacteria, UA FEW (*)    All other components within normal limits  URINE CULTURE  LIPASE, BLOOD    EKG None  Radiology CT Renal Stone Study  Result Date: 06/01/2020 CLINICAL DATA:  Acute onset left flank pain this morning. EXAM: CT ABDOMEN AND PELVIS WITHOUT CONTRAST TECHNIQUE: Multidetector CT imaging of the abdomen and pelvis was performed following the standard protocol without IV contrast. COMPARISON:  CT abdomen and pelvis 03/01/2020. FINDINGS: Lower chest: Calcific coronary artery disease noted. Minimal dependent atelectasis. No pleural or pericardial effusion. Hepatobiliary: Tiny stones are seen layering No evidence of cholecystitis dependently in the gallbladder. There is fatty infiltration of the liver. No focal lesion is identified. The liver measures 23 cm craniocaudal. Biliary tree is unremarkable. Pancreas: Unremarkable. No pancreatic ductal dilatation or surrounding inflammatory changes. Spleen: Normal in size without focal abnormality. Adrenals/Urinary Tract: The adrenal glands appear normal. The patient has mild right hydronephrosis which is improved compared to the prior examination. Previously seen punctate right ureteral stone is no longer present. No right renal stone. Cyst in the  right kidney is unchanged. 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 There is moderate hydronephrosis in the lower pole moiety due to a 0.7 cm stone just proximal to the UVJ. Two stones in the lower pole moiety are also present. The larger measures 0.6 cm. There is also scarring in the lower pole. The upper pole moiety is unremarkable. Urinary bladder appears normal. Stomach/Bowel: Suture material along the stomach noted. The stomach is otherwise within normal limits. Appendix appears normal. The appendix is not visualized but no evidence of appendicitis is seen. No evidence of bowel wall thickening, distention, or inflammatory changes. Scattered diverticulosis. Vascular/Lymphatic: Aortic atherosclerosis. No enlarged abdominal or pelvic lymph nodes. Reproductive: Prostatomegaly. Other: None. Musculoskeletal: No acute or focal abnormality. 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 There is moderate hydronephrosis in the lower pole moiety due to a 0.7 stone just proximal to the UVJ. Two nonobstructing stones in the lower pole of the left kidney are also seen. Minimal fullness of the right intrarenal collecting system is improved compared to the prior study. Previously seen right ureteral stone is no longer present. Hepatomegaly and fatty infiltration of the liver. Gallstones without evidence of cholecystitis. Diverticulosis without diverticulitis. Calcific coronary artery disease. Aortic Atherosclerosis (ICD10-I70.0). Prostatomegaly. Electronically Signed   By: Inge Rise M.D.   On: 06/01/2020 11:07    Procedures Procedures (including critical care time)  Medications Ordered in ED Medications  sodium chloride 0.9 % bolus 1,000 mL (0 mLs Intravenous Stopped 06/01/20 1114)  HYDROmorphone (DILAUDID) injection 1 mg (1 mg  Intravenous Given 06/01/20 1003)    ED Course  I have reviewed the triage vital signs and the nursing notes.  Pertinent labs & imaging results that were available during my care of the patient were reviewed by me and considered in my medical decision making (see chart for details).  MDM Rules/Calculators/A&P                          Dylan Reyes is a 69 year old male with history of kidney stones presents the ED with left flank pain.  Patient with unremarkable vitals.  No fever.  Hematuria and flank pain started yesterday.  Suspect kidney stone versus pyelonephritis.  Less likely bowel obstruction.  Lab work with no significant anemia, electrolyte abnormality, kidney injury.  Urinalysis negative for infection.  Awaiting CT scan.  Patient given IV fluids and IV Dilaudid with improvement.  Patient with 7 mm kidney stone on the left.  Otherwise lab work is unremarkable.  Pain controlled.  Has follow-up with urology already in place for Tuesday.  Understands return precautions and discharged in ED in good condition.  Overall uncomplicated kidney stone.  This chart was dictated using voice recognition software.  Despite best efforts to proofread,  errors can occur which can change the documentation meaning.    Final Clinical Impression(s) / ED Diagnoses Final diagnoses:  Kidney stone    Rx / DC Orders ED Discharge Orders         Ordered    oxyCODONE (ROXICODONE) 5 MG immediate release tablet  Every 6 hours PRN     Discontinue  Reprint     06/01/20 1117    tamsulosin (FLOMAX) 0.4 MG CAPS capsule  Daily     Discontinue  Reprint     06/01/20 1117    ondansetron (ZOFRAN) 4 MG tablet  Every 6 hours     Discontinue  Reprint     06/01/20 1117           Hatsue Sime, DO 06/01/20 1120

## 2020-06-02 LAB — URINE CULTURE: Culture: NO GROWTH

## 2021-08-30 ENCOUNTER — Emergency Department (HOSPITAL_BASED_OUTPATIENT_CLINIC_OR_DEPARTMENT_OTHER): Payer: Medicare Other

## 2021-08-30 ENCOUNTER — Observation Stay (HOSPITAL_COMMUNITY): Payer: Medicare Other

## 2021-08-30 ENCOUNTER — Observation Stay (HOSPITAL_BASED_OUTPATIENT_CLINIC_OR_DEPARTMENT_OTHER)
Admission: EM | Admit: 2021-08-30 | Discharge: 2021-08-31 | Disposition: A | Discharge status: Home / Self Care | Payer: Medicare Other | Attending: Internal Medicine | Admitting: Internal Medicine

## 2021-08-30 ENCOUNTER — Encounter (HOSPITAL_BASED_OUTPATIENT_CLINIC_OR_DEPARTMENT_OTHER): Payer: Self-pay

## 2021-08-30 ENCOUNTER — Other Ambulatory Visit: Payer: Self-pay

## 2021-08-30 DIAGNOSIS — Z20822 Contact with and (suspected) exposure to covid-19: Secondary | ICD-10-CM | POA: Insufficient documentation

## 2021-08-30 DIAGNOSIS — Z85850 Personal history of malignant neoplasm of thyroid: Secondary | ICD-10-CM | POA: Insufficient documentation

## 2021-08-30 DIAGNOSIS — E119 Type 2 diabetes mellitus without complications: Secondary | ICD-10-CM | POA: Insufficient documentation

## 2021-08-30 DIAGNOSIS — Z0189 Encounter for other specified special examinations: Secondary | ICD-10-CM

## 2021-08-30 DIAGNOSIS — E785 Hyperlipidemia, unspecified: Secondary | ICD-10-CM

## 2021-08-30 DIAGNOSIS — E039 Hypothyroidism, unspecified: Secondary | ICD-10-CM

## 2021-08-30 DIAGNOSIS — Z7982 Long term (current) use of aspirin: Secondary | ICD-10-CM | POA: Diagnosis not present

## 2021-08-30 DIAGNOSIS — I10 Essential (primary) hypertension: Secondary | ICD-10-CM | POA: Insufficient documentation

## 2021-08-30 DIAGNOSIS — Z7984 Long term (current) use of oral hypoglycemic drugs: Secondary | ICD-10-CM | POA: Diagnosis not present

## 2021-08-30 DIAGNOSIS — R109 Unspecified abdominal pain: Secondary | ICD-10-CM | POA: Diagnosis present

## 2021-08-30 DIAGNOSIS — Z79899 Other long term (current) drug therapy: Secondary | ICD-10-CM | POA: Diagnosis not present

## 2021-08-30 DIAGNOSIS — K56609 Unspecified intestinal obstruction, unspecified as to partial versus complete obstruction: Secondary | ICD-10-CM | POA: Diagnosis not present

## 2021-08-30 LAB — COMPREHENSIVE METABOLIC PANEL
ALT: 19 U/L (ref 0–44)
AST: 20 U/L (ref 15–41)
Albumin: 4.4 g/dL (ref 3.5–5.0)
Alkaline Phosphatase: 69 U/L (ref 38–126)
Anion gap: 15 (ref 5–15)
BUN: 21 mg/dL (ref 8–23)
CO2: 27 mmol/L (ref 22–32)
Calcium: 10 mg/dL (ref 8.9–10.3)
Chloride: 96 mmol/L — ABNORMAL LOW (ref 98–111)
Creatinine, Ser: 1.26 mg/dL — ABNORMAL HIGH (ref 0.61–1.24)
GFR, Estimated: >60 mL/min (ref >60)
Glucose, Bld: 237 mg/dL — ABNORMAL HIGH (ref 70–99)
Potassium: 4 mmol/L (ref 3.5–5.1)
Sodium: 138 mmol/L (ref 135–145)
Total Bilirubin: 1.2 mg/dL (ref 0.3–1.2)
Total Protein: 8.1 g/dL (ref 6.5–8.1)

## 2021-08-30 LAB — URINALYSIS, ROUTINE W REFLEX MICROSCOPIC
Bacteria, UA: NONE SEEN
Bilirubin Urine: NEGATIVE
Glucose, UA: >=500 mg/dL — AB
Hgb urine dipstick: NEGATIVE
Ketones, ur: 20 mg/dL — AB
Leukocytes,Ua: NEGATIVE
Nitrite: NEGATIVE
Protein, ur: NEGATIVE mg/dL
Specific Gravity, Urine: >1.046 — ABNORMAL HIGH (ref 1.005–1.030)
pH: 5 (ref 5.0–8.0)

## 2021-08-30 LAB — CBC
HCT: 51.3 % (ref 39.0–52.0)
Hemoglobin: 17.3 g/dL — ABNORMAL HIGH (ref 13.0–17.0)
MCH: 31.7 pg (ref 26.0–34.0)
MCHC: 33.7 g/dL (ref 30.0–36.0)
MCV: 94 fL (ref 80.0–100.0)
Platelets: 342 10*3/uL (ref 150–400)
RBC: 5.46 MIL/uL (ref 4.22–5.81)
RDW: 12.7 % (ref 11.5–15.5)
WBC: 8.8 10*3/uL (ref 4.0–10.5)
nRBC: 0 % (ref 0.0–0.2)

## 2021-08-30 LAB — RESP PANEL BY RT-PCR (FLU A&B, COVID) ARPGX2
Influenza A by PCR: NEGATIVE
Influenza B by PCR: NEGATIVE
SARS Coronavirus 2 by RT PCR: NEGATIVE

## 2021-08-30 LAB — LIPASE, BLOOD: Lipase: 27 U/L (ref 11–51)

## 2021-08-30 LAB — GLUCOSE, CAPILLARY: Glucose-Capillary: 186 mg/dL — ABNORMAL HIGH (ref 70–99)

## 2021-08-30 MED ORDER — ONDANSETRON HCL 4 MG/2ML IJ SOLN
4.0000 mg | Freq: Once | INTRAMUSCULAR | Status: AC
  Administered 2021-08-30: 4 mg via INTRAVENOUS
  Filled 2021-08-30: qty 2

## 2021-08-30 MED ORDER — DIPHENHYDRAMINE HCL 50 MG/ML IJ SOLN
12.5000 mg | Freq: Once | INTRAMUSCULAR | Status: AC
  Administered 2021-08-30: 12.5 mg via INTRAVENOUS
  Filled 2021-08-30: qty 1

## 2021-08-30 MED ORDER — SODIUM CHLORIDE 0.9 % IV SOLN: INTRAVENOUS | Status: DC

## 2021-08-30 MED ORDER — ONDANSETRON HCL 4 MG PO TABS: 4.0000 mg | ORAL_TABLET | Freq: Four times a day (QID) | ORAL | Status: DC | PRN

## 2021-08-30 MED ORDER — PHENOL 1.4 % MT LIQD
1.0000 | OROMUCOSAL | Status: DC | PRN
  Administered 2021-08-30: 1 via OROMUCOSAL
  Filled 2021-08-30: qty 177

## 2021-08-30 MED ORDER — ONDANSETRON HCL 4 MG/2ML IJ SOLN: 4.0000 mg | Freq: Four times a day (QID) | INTRAMUSCULAR | Status: DC | PRN

## 2021-08-30 MED ORDER — TRAZODONE HCL 50 MG PO TABS
50.0000 mg | ORAL_TABLET | Freq: Once | ORAL | Status: DC
  Filled 2021-08-30: qty 1

## 2021-08-30 MED ORDER — HYDRALAZINE HCL 20 MG/ML IJ SOLN: 10.0000 mg | Freq: Three times a day (TID) | INTRAMUSCULAR | Status: DC | PRN

## 2021-08-30 MED ORDER — DIATRIZOATE MEGLUMINE & SODIUM 66-10 % PO SOLN
90.0000 mL | Freq: Once | ORAL | Status: AC
  Administered 2021-08-30: 90 mL via NASOGASTRIC
  Filled 2021-08-30: qty 90

## 2021-08-30 MED ORDER — IOHEXOL 350 MG/ML SOLN
100.0000 mL | Freq: Once | INTRAVENOUS | Status: AC | PRN
  Administered 2021-08-30: 100 mL via INTRAVENOUS

## 2021-08-30 MED ORDER — INSULIN ASPART 100 UNIT/ML IJ SOLN
0.0000 [IU] | Freq: Three times a day (TID) | INTRAMUSCULAR | Status: DC
  Administered 2021-08-31 (×2): 3 [IU] via SUBCUTANEOUS

## 2021-08-30 NOTE — Consult Note (Signed)
CC: Small bowel obstruction  Requesting provider: Dr. Sylvan Cheese  HPI: Dylan Reyes is an 70 y.o. male who is transferred here from [place] for evaluation of a small bowel obstruction.  The patient started having cramping abdominal pain on Friday.  He then started having nausea and vomiting Friday and Saturday.  His last episode was 8 AM today.  He is still having some cramping abdominal pain.  His last bowel movement was Thursday.  He underwent a CAT scan of the abdomen and pelvis showing dilated proximal small bowel with collapsed distal ventricularization of the bowel consistent with a bowel obstruction.  There is no obvious ischemic changes on CT scan.  His white blood count was normal.  He was dehydrated based on his laboratory data.  He was transferred here for further evaluation.  He denies chest pain or shortness of breath. He has an extensive past surgical history.  His last surgery was a laparoscopic sleeve gastrectomy at [facility] in 2011.  He has been operated on here 20 years ago for about obstruction.  This was from an adhesive band without prior surgical history.  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  His last procedure here in [place] was an attempted laparoscopic Roux-en-Y but the procedure was aborted secondary to extensive intra-abdominal adhesions after Dr. Excell Seltzer did a significant amount of lysis of adhesions.  He has not had any admissions for bowel obstruction since his original obstruction.  He does get crampy abdominal pain about 2-3 times a year per his report.  Past Medical History:  Diagnosis Date   Bowel obstruction ([place])    Diabetes ([place])    Dr. Chalmers Cater   Hypercholesteremia    Hypertension    Thyroid cancer [facility])    Thyroid disease    thyroid cancer 09/02 with radioactive iodine 7/06     Past Surgical History:  Procedure Laterality Date   ABDOMINAL SURGERY     COLON SURGERY      HERNIA REPAIR     LAPAROSCOPIC GASTRIC SLEEVE RESECTION     THYROIDECTOMY      History reviewed. No pertinent family history.  Social:  reports that he has never smoked. He has never used smokeless tobacco. He reports current alcohol use. He reports that he does not use drugs.  Allergies:  Allergies  Allergen Reactions   Ciprofloxacin     Medications: I have reviewed the patient's current medications.  Results for orders placed or performed during the hospital encounter of 08/30/21 (from the past 48 hour(s))  Lipase, blood     Status: None   Collection Time: 08/30/21 10:33 AM  Result Value Ref Range   Lipase 27 11 - 51 U/L    Comment: Performed at [facility], [address] [place], [place] [address]  Comprehensive metabolic panel     Status: Abnormal   Collection Time: 08/30/21 10:33 AM  Result Value Ref Range   Sodium 138 135 - 145 mmol/L   Potassium 4.0 3.5 - 5.1 mmol/L   Chloride 96 (L) 98 - 111 mmol/L   CO2 27 22 - 32 mmol/L   Glucose, Bld 237 (H) 70 - 99 mg/dL    Comment: Glucose reference range applies only to samples taken after fasting for at least 8 hours.   BUN 21 8 - 23 mg/dL   Creatinine, Ser 1.26 (H) 0.61 - 1.24 mg/dL   Calcium  10.0 8.9 - 10.3 mg/dL   Total Protein 8.1 6.5 - 8.1 g/dL   Albumin 4.4 3.5 - 5.0 g/dL   AST 20 15 - 41 U/L   ALT 19 0 - 44 U/L   Alkaline Phosphatase 69 38 - 126 U/L   Total Bilirubin 1.2 0.3 - 1.2 mg/dL   GFR, Estimated >60 >60 mL/min    Comment: (NOTE) Calculated using the CKD-EPI Creatinine Equation (2021)    Anion gap 15 5 - 15    Comment: Performed at [facility], [address] [place], [place] [address]  CBC     Status: Abnormal   Collection Time: 08/30/21 10:33 AM  Result Value Ref Range   WBC 8.8 4.0 - 10.5 K/uL   RBC 5.46 4.22 - 5.81 MIL/uL   Hemoglobin 17.3 (H) 13.0 - 17.0 g/dL   HCT 51.3 39.0 - 52.0 %   MCV 94.0 80.0 - 100.0 fL   MCH 31.7 26.0 - 34.0 pg   MCHC 33.7 30.0 - 36.0 g/dL    RDW 12.7 11.5 - 15.5 %   Platelets 342 150 - 400 K/uL   nRBC 0.0 0.0 - 0.2 %    Comment: Performed at [facility], [address] [place], [place] [address]  Resp Panel by RT-PCR (Flu A&B, Covid) Nasopharyngeal Swab     Status: None   Collection Time: 08/30/21 12:33 PM   Specimen: Nasopharyngeal Swab; Nasopharyngeal(NP) swabs in vial transport medium  Result Value Ref Range   SARS Coronavirus 2 by RT PCR NEGATIVE NEGATIVE    Comment: (NOTE) SARS-CoV-2 target nucleic acids are NOT DETECTED.  The SARS-CoV-2 RNA is generally detectable in upper respiratory specimens during the acute phase of infection. The lowest concentration of SARS-CoV-2 viral copies this assay can detect is 138 copies/mL. A negative result does not preclude SARS-Cov-2 infection and should not be used as the sole basis for treatment or other patient management decisions. A negative result may occur with  improper specimen collection/handling, submission of specimen other than nasopharyngeal swab, presence of viral mutation(s) within the areas targeted by this assay, and inadequate number of viral copies(<138 copies/mL). A negative result must be combined with clinical observations, patient history, and epidemiological information. The expected result is Negative.  Fact Sheet for Patients:  EntrepreneurPulse.com.au  Fact Sheet for Healthcare Providers:  IncredibleEmployment.be  This test is no t yet approved or cleared by the [place] FDA and  has been authorized for detection and/or diagnosis of SARS-CoV-2 by FDA under an Emergency Use Authorization (EUA). This EUA will remain  in effect (meaning this test can be used) for the duration of the COVID-19 declaration under Section 564(b)(1) of the Act, 21 U.S.C.section 360bbb-3(b)(1), unless the authorization is terminated  or revoked sooner.       Influenza A by PCR NEGATIVE NEGATIVE   Influenza B by PCR  NEGATIVE NEGATIVE    Comment: (NOTE) The Xpert Xpress SARS-CoV-2/FLU/RSV plus assay is intended as an aid in the diagnosis of influenza from Nasopharyngeal swab specimens and should not be used as a sole basis for treatment. Nasal washings and aspirates are unacceptable for Xpert Xpress SARS-CoV-2/FLU/RSV testing.  Fact Sheet for Patients: EntrepreneurPulse.com.au  Fact Sheet for Healthcare Providers: IncredibleEmployment.be  This test is not yet approved or cleared by the [place] FDA and has been authorized for detection and/or diagnosis of SARS-CoV-2 by FDA under an Emergency Use Authorization (EUA). This EUA will remain in effect (meaning this  test can be used) for the duration of the COVID-19 declaration under Section 564(b)(1) of the Act, 21 U.S.C. section 360bbb-3(b)(1), unless the authorization is terminated or revoked.  Performed at [facility], [address] [place], [place] [address]     CT Angio Abd/Pel W and/or Wo Contrast  Result Date: 08/30/2021 CLINICAL DATA:  Acute generalized abdominal pain, severe cramping, vomiting EXAM: CT ANGIOGRAPHY ABDOMEN AND PELVIS WITH CONTRAST AND WITHOUT CONTRAST TECHNIQUE: Multidetector CT imaging of the abdomen and pelvis was performed using the standard protocol during bolus administration of intravenous contrast. Multiplanar reconstructed images and MIPs were obtained and reviewed to evaluate the vascular anatomy. CONTRAST:  159mL OMNIPAQUE IOHEXOL 350 MG/ML SOLN COMPARISON:  06/01/2020 FINDINGS: VASCULAR Aorta: Infrarenal aorta and iliac bifurcation atherosclerosis noted without aneurysm, dissection, occlusive process, or acute aortic abnormality. No retroperitoneal hemorrhage, hematoma, evidence of rupture, or surrounding inflammatory process. Celiac: Minor atherosclerotic change. Widely patent origin including its branches. SMA: Origin atherosclerotic change without significant  stenosis or occlusion. SMA remains patent including its branches in the central mesentery. Renals: Widely patent main renal arteries. No renal artery stenosis. No accessory renal artery appreciated. IMA: Remains patent off the distal aorta including its branches. Inflow: Common, internal and external iliac arteries all remain patent. No inflow disease or iliac occlusion. Minor atherosclerotic change. Proximal Outflow: Common femoral, proximal profunda femoral, proximal superficial femoral arteries all remain patent. Veins: No veno-occlusive process. Review of the MIP images confirms the above findings. NON-VASCULAR Lower chest: Minor basilar atelectasis. Normal heart size. No pericardial pleural effusion. Thoracic aortic atherosclerosis. Hepatobiliary: Focal fatty infiltration of the liver along the falciform ligament anteriorly. No biliary dilatation or other focal hepatic abnormality. Gallbladder nondistended. Common bile duct nondilated. Pancreas: Unremarkable. No pancreatic ductal dilatation or surrounding inflammatory changes. Spleen: Normal in size without focal abnormality. Adrenals/Urinary Tract: Normal adrenal glands. Right kidney lower pole 2 cm renal cyst noted. Left kidney lower pole cortical atrophy and punctate 3 mm nonobstructing calculus noted. No renal obstruction pattern or hydronephrosis. Ureters are symmetric and decompressed. Urinary bladder unremarkable. Stomach/Bowel: Previous gastric sleeve surgery. Dilated duodenum as well as several loops of proximal and mid jejunum in the central abdomen. Dilated loops have air-fluid levels and angulation. Fecalization of the small bowel noted where there is a transition point to collapsed small bowel, image 69/10. Findings compatible with small-bowel obstruction without clear etiology but favored to be related to adhesions. No definitive bowel wall thickening. No pneumatosis or free air. No portal venous air. No free fluid, fluid collection, hemorrhage,  hematoma, or abscess. Lymphatic: No bulky adenopathy. Reproductive: No significant finding by CT. Other: No ascites. Postop changes of the abdominal wall with muscular atrophy and diastasis. No herniation. Musculoskeletal: Degenerative changes of the spine. Multilevel lumbar facet arthropathy. No acute osseous finding. IMPRESSION: VASCULAR Aortoiliac atherosclerosis as well abdominal atherosclerosis but negative for mesenteric occlusive disease. Mesenteric and renal vasculature all remain patent. No acute intra-abdominal or pelvic vascular process. NON-VASCULAR Postop changes of previous gastric sleeve surgery with dilated proximal small bowel including the duodenum and jejunum with dilated loops showing angulation and fecalization. Findings compatible with small-bowel obstruction suspect related to adhesions. No evidence of abscess or perforation. Electronically Signed   By: Jerilynn Mages.  Shick M.D.   On: 08/30/2021 11:57    ROS - all of the below systems have been reviewed with the patient and positives are indicated with bold text General: chills, fever or night sweats Eyes: blurry vision or double vision ENT: epistaxis  or sore throat Allergy/Immunology: itchy/watery eyes or nasal congestion Hematologic/Lymphatic: bleeding problems, blood clots or swollen lymph nodes Endocrine: temperature intolerance or unexpected weight changes Breast: new or changing breast lumps or nipple discharge Resp: cough, shortness of breath, or wheezing CV: chest pain or dyspnea on exertion GI: as per HPI GU: dysuria, trouble voiding, or hematuria MSK: joint pain or joint stiffness Neuro: TIA or stroke symptoms Derm: pruritus and skin lesion changes Psych: anxiety and depression  PE Blood pressure (!) 148/74, pulse 80, temperature 99.6 F (37.6 C), temperature source Oral, resp. rate 16, height 5\' 9"  (1.753 m), weight 109.8 kg, SpO2 98 %. Constitutional: NAD; conversant; no deformities Eyes: Moist conjunctiva; no lid lag;  anicteric; PERRL Neck: Trachea midline; no thyromegaly Lungs: Normal respiratory effort; no tactile fremitus CV: RRR; no palpable thrills; no pitting edema GI: Abd obese, some distention.  Significant laxity of the abdominal wall without true hernia, minimal tenderness with no peritonitis; no palpable hepatosplenomegaly MSK: Normal range of motion of extremities; no clubbing/cyanosis Psychiatric: Appropriate affect; alert and oriented x3 Lymphatic: No palpable cervical or axillary lymphadenopathy  Results for orders placed or performed during the hospital encounter of 08/30/21 (from the past 48 hour(s))  Lipase, blood     Status: None   Collection Time: 08/30/21 10:33 AM  Result Value Ref Range   Lipase 27 11 - 51 U/L    Comment: Performed at [facility], [address] [place], [place] [address]  Comprehensive metabolic panel     Status: Abnormal   Collection Time: 08/30/21 10:33 AM  Result Value Ref Range   Sodium 138 135 - 145 mmol/L   Potassium 4.0 3.5 - 5.1 mmol/L   Chloride 96 (L) 98 - 111 mmol/L   CO2 27 22 - 32 mmol/L   Glucose, Bld 237 (H) 70 - 99 mg/dL    Comment: Glucose reference range applies only to samples taken after fasting for at least 8 hours.   BUN 21 8 - 23 mg/dL   Creatinine, Ser 1.26 (H) 0.61 - 1.24 mg/dL   Calcium 10.0 8.9 - 10.3 mg/dL   Total Protein 8.1 6.5 - 8.1 g/dL   Albumin 4.4 3.5 - 5.0 g/dL   AST 20 15 - 41 U/L   ALT 19 0 - 44 U/L   Alkaline Phosphatase 69 38 - 126 U/L   Total Bilirubin 1.2 0.3 - 1.2 mg/dL   GFR, Estimated >60 >60 mL/min    Comment: (NOTE) Calculated using the CKD-EPI Creatinine Equation (2021)    Anion gap 15 5 - 15    Comment: Performed at [facility], [address] [place], [place] [address]  CBC     Status: Abnormal   Collection Time: 08/30/21 10:33 AM  Result Value Ref Range   WBC 8.8 4.0 - 10.5 K/uL   RBC 5.46 4.22 - 5.81 MIL/uL   Hemoglobin 17.3 (H) 13.0 - 17.0 g/dL   HCT 51.3 39.0 - 52.0 %    MCV 94.0 80.0 - 100.0 fL   MCH 31.7 26.0 - 34.0 pg   MCHC 33.7 30.0 - 36.0 g/dL   RDW 12.7 11.5 - 15.5 %   Platelets 342 150 - 400 K/uL   nRBC 0.0 0.0 - 0.2 %    Comment: Performed at [facility], [address] [place], [place] [address]  Resp Panel by RT-PCR (Flu A&B, Covid) Nasopharyngeal Swab     Status: None   Collection  Time: 08/30/21 12:33 PM   Specimen: Nasopharyngeal Swab; Nasopharyngeal(NP) swabs in vial transport medium  Result Value Ref Range   SARS Coronavirus 2 by RT PCR NEGATIVE NEGATIVE    Comment: (NOTE) SARS-CoV-2 target nucleic acids are NOT DETECTED.  The SARS-CoV-2 RNA is generally detectable in upper respiratory specimens during the acute phase of infection. The lowest concentration of SARS-CoV-2 viral copies this assay can detect is 138 copies/mL. A negative result does not preclude SARS-Cov-2 infection and should not be used as the sole basis for treatment or other patient management decisions. A negative result may occur with  improper specimen collection/handling, submission of specimen other than nasopharyngeal swab, presence of viral mutation(s) within the areas targeted by this assay, and inadequate number of viral copies(<138 copies/mL). A negative result must be combined with clinical observations, patient history, and epidemiological information. The expected result is Negative.  Fact Sheet for Patients:  EntrepreneurPulse.com.au  Fact Sheet for Healthcare Providers:  IncredibleEmployment.be  This test is no t yet approved or cleared by the [place] FDA and  has been authorized for detection and/or diagnosis of SARS-CoV-2 by FDA under an Emergency Use Authorization (EUA). This EUA will remain  in effect (meaning this test can be used) for the duration of the COVID-19 declaration under Section 564(b)(1) of the Act, 21 U.S.C.section 360bbb-3(b)(1), unless the authorization is terminated  or  revoked sooner.       Influenza A by PCR NEGATIVE NEGATIVE   Influenza B by PCR NEGATIVE NEGATIVE    Comment: (NOTE) The Xpert Xpress SARS-CoV-2/FLU/RSV plus assay is intended as an aid in the diagnosis of influenza from Nasopharyngeal swab specimens and should not be used as a sole basis for treatment. Nasal washings and aspirates are unacceptable for Xpert Xpress SARS-CoV-2/FLU/RSV testing.  Fact Sheet for Patients: EntrepreneurPulse.com.au  Fact Sheet for Healthcare Providers: IncredibleEmployment.be  This test is not yet approved or cleared by the [place] FDA and has been authorized for detection and/or diagnosis of SARS-CoV-2 by FDA under an Emergency Use Authorization (EUA). This EUA will remain in effect (meaning this test can be used) for the duration of the COVID-19 declaration under Section 564(b)(1) of the Act, 21 U.S.C. section 360bbb-3(b)(1), unless the authorization is terminated or revoked.  Performed at [facility], [address] [place], [place] [address]     CT Angio Abd/Pel W and/or Wo Contrast  Result Date: 08/30/2021 CLINICAL DATA:  Acute generalized abdominal pain, severe cramping, vomiting EXAM: CT ANGIOGRAPHY ABDOMEN AND PELVIS WITH CONTRAST AND WITHOUT CONTRAST TECHNIQUE: Multidetector CT imaging of the abdomen and pelvis was performed using the standard protocol during bolus administration of intravenous contrast. Multiplanar reconstructed images and MIPs were obtained and reviewed to evaluate the vascular anatomy. CONTRAST:  167mL OMNIPAQUE IOHEXOL 350 MG/ML SOLN COMPARISON:  06/01/2020 FINDINGS: VASCULAR Aorta: Infrarenal aorta and iliac bifurcation atherosclerosis noted without aneurysm, dissection, occlusive process, or acute aortic abnormality. No retroperitoneal hemorrhage, hematoma, evidence of rupture, or surrounding inflammatory process. Celiac: Minor atherosclerotic change. Widely patent  origin including its branches. SMA: Origin atherosclerotic change without significant stenosis or occlusion. SMA remains patent including its branches in the central mesentery. Renals: Widely patent main renal arteries. No renal artery stenosis. No accessory renal artery appreciated. IMA: Remains patent off the distal aorta including its branches. Inflow: Common, internal and external iliac arteries all remain patent. No inflow disease or iliac occlusion. Minor atherosclerotic change. Proximal Outflow: Common femoral, proximal profunda femoral, proximal superficial femoral arteries all  remain patent. Veins: No veno-occlusive process. Review of the MIP images confirms the above findings. NON-VASCULAR Lower chest: Minor basilar atelectasis. Normal heart size. No pericardial pleural effusion. Thoracic aortic atherosclerosis. Hepatobiliary: Focal fatty infiltration of the liver along the falciform ligament anteriorly. No biliary dilatation or other focal hepatic abnormality. Gallbladder nondistended. Common bile duct nondilated. Pancreas: Unremarkable. No pancreatic ductal dilatation or surrounding inflammatory changes. Spleen: Normal in size without focal abnormality. Adrenals/Urinary Tract: Normal adrenal glands. Right kidney lower pole 2 cm renal cyst noted. Left kidney lower pole cortical atrophy and punctate 3 mm nonobstructing calculus noted. No renal obstruction pattern or hydronephrosis. Ureters are symmetric and decompressed. Urinary bladder unremarkable. Stomach/Bowel: Previous gastric sleeve surgery. Dilated duodenum as well as several loops of proximal and mid jejunum in the central abdomen. Dilated loops have air-fluid levels and angulation. Fecalization of the small bowel noted where there is a transition point to collapsed small bowel, image 69/10. Findings compatible with small-bowel obstruction without clear etiology but favored to be related to adhesions. No definitive bowel wall thickening. No  pneumatosis or free air. No portal venous air. No free fluid, fluid collection, hemorrhage, hematoma, or abscess. Lymphatic: No bulky adenopathy. Reproductive: No significant finding by CT. Other: No ascites. Postop changes of the abdominal wall with muscular atrophy and diastasis. No herniation. Musculoskeletal: Degenerative changes of the spine. Multilevel lumbar facet arthropathy. No acute osseous finding. IMPRESSION: VASCULAR Aortoiliac atherosclerosis as well abdominal atherosclerosis but negative for mesenteric occlusive disease. Mesenteric and renal vasculature all remain patent. No acute intra-abdominal or pelvic vascular process. NON-VASCULAR Postop changes of previous gastric sleeve surgery with dilated proximal small bowel including the duodenum and jejunum with dilated loops showing angulation and fecalization. Findings compatible with small-bowel obstruction suspect related to adhesions. No evidence of abscess or perforation. Electronically Signed   By: Jerilynn Mages.  Shick M.D.   On: 08/30/2021 11:57     A/P: Small bowel obstruction  Given his significant previous surgical history, this is suspected to be secondary to adhesions.  No other intra-abdominal pathology identified on the CT scan.  He does have significantly dilated proximal small bowel.  Given his multiple episodes of emesis and the findings on the scan, I recommended nasogastric tube be placed to low wall suction.  I have discussed this with him and his wife.  I will then order the small bowel protocol for further evaluation of his small bowel.  Hopefully the bowel obstruction will resolve with conservative management including bowel rest, nasogastric suctioning, and IV rehydration.  We will follow him closely with you.  Coralie Keens, MD [facility] Surgery Use [email or web address] to contact on call provider

## 2021-08-30 NOTE — ED Provider Notes (Signed)
[facility] Provider Note   CSN: [id number] Arrival date & time: 08/30/21  1006     History Chief Complaint  Patient presents with   Abdominal Pain    JANZIEL HOCKETT is a 70 y.o. male.  Presented to the emergency room with concern for abdominal pain.  Patient has been having intermittent pain since Friday.  Pain worse after trying to eat or drink.  Improved with bowel rest.  Also having associated vomiting, unable to tolerate any p.o.  Still passing gas but no bowel movement since Thursday.  Currently does not have any pain.  Has had prior bowel obstruction.  Gastric sleeve at [facility] a few years ago, multiple hernia repairs around a decade ago.  HPI     Past Medical History:  Diagnosis Date   Bowel obstruction ([place])    Diabetes ([place])    Dr. Chalmers Cater   Hypercholesteremia    Hypertension    Thyroid cancer [facility])    Thyroid disease    thyroid cancer 09/02 with radioactive iodine 7/06     Patient Active Problem List   Diagnosis Date Noted   SBO (small bowel obstruction) ([place]) 08/30/2021    Past Surgical History:  Procedure Laterality Date   ABDOMINAL SURGERY     COLON SURGERY     HERNIA REPAIR     LAPAROSCOPIC GASTRIC SLEEVE RESECTION     THYROIDECTOMY         History reviewed. No pertinent family history.  Social History   Tobacco Use   Smoking status: Never   Smokeless tobacco: Never  Substance Use Topics   Alcohol use: Yes    Comment: daily   Drug use: No    Home Medications Prior to Admission medications   Medication Sig Start Date End Date Taking? Authorizing Provider  aspirin 81 MG tablet Take 81 mg by mouth daily.   Yes [provider]  co-enzyme Q-10 30 MG capsule Take 30 mg by mouth 3 (three) times daily.   Yes [provider]  empagliflozin (JARDIANCE) 25 MG TABS tablet Take by mouth daily.   Yes [provider]  levothyroxine (SYNTHROID, LEVOTHROID) 200 MCG tablet Take 200 mcg by  mouth daily before breakfast.   Yes [provider]  lisinopril (PRINIVIL,ZESTRIL) 20 MG tablet Take 20 mg by mouth daily.   Yes [provider]  metFORMIN (GLUCOPHAGE) 1000 MG tablet Take 1,000 mg by mouth 2 (two) times daily with a meal.   Yes [provider]  Multiple Vitamin (MULTIVITAMIN) capsule Take 1 capsule by mouth daily.   Yes [provider]  Omega-3 Fatty Acids (FISH OIL) 1000 MG CAPS Take by mouth.   Yes [provider]  rosuvastatin (CRESTOR) 10 MG tablet Take 10 mg by mouth daily.   Yes [provider]  Turmeric (QC TUMERIC COMPLEX PO) Take by mouth.   Yes [provider]  zinc gluconate 50 MG tablet Take 50 mg by mouth daily.   Yes [provider]  hydrochlorothiazide (MICROZIDE) 12.5 MG capsule Take 12.5 mg by mouth daily.    [provider]  oxyCODONE (ROXICODONE) 5 MG immediate release tablet Take 1 tablet (5 mg total) by mouth every 6 (six) hours as needed for up to 20 doses for breakthrough pain. 06/01/20   Curatolo, Adam, DO  simvastatin (ZOCOR) 40 MG tablet Take 40 mg by mouth daily.    [provider]  sulfamethoxazole-trimethoprim (BACTRIM DS,SEPTRA DS) 800-160 MG tablet Take 1  tablet by mouth 2 (two) times daily. 04/22/16   Tanna Furry, MD    Allergies    Ciprofloxacin  Review of Systems   Review of Systems  Constitutional:  Positive for fatigue. Negative for chills and fever.  HENT:  Negative for ear pain and sore throat.   Eyes:  Negative for pain and visual disturbance.  Respiratory:  Negative for cough and shortness of breath.   Cardiovascular:  Negative for chest pain and palpitations.  Gastrointestinal:  Positive for abdominal pain, nausea and vomiting.  Genitourinary:  Negative for dysuria and hematuria.  Musculoskeletal:  Negative for arthralgias and back pain.  Skin:  Negative for color change and rash.  Neurological:  Negative for seizures and syncope.  All other  systems reviewed and are negative.  Physical Exam Updated Vital Signs BP (!) 145/82   Pulse 80   Temp 97.7 F (36.5 C) (Oral)   Resp 18   Ht 5\' 9"  (1.753 m)   Wt 109.8 kg   SpO2 97%   BMI 35.74 kg/m   Physical Exam Vitals and nursing note reviewed.  Constitutional:      Appearance: He is well-developed.  HENT:     Head: Normocephalic and atraumatic.  Eyes:     Conjunctiva/sclera: Conjunctivae normal.  Cardiovascular:     Rate and Rhythm: Normal rate and regular rhythm.     Heart sounds: No murmur heard. Pulmonary:     Effort: Pulmonary effort is normal. No respiratory distress.     Breath sounds: Normal breath sounds.  Abdominal:     Palpations: Abdomen is soft.     Tenderness: There is abdominal tenderness.     Comments: Generalized tenderness to palpation on exam, no rebound or guarding  Musculoskeletal:     Cervical back: Neck supple.  Skin:    General: Skin is warm and dry.  Neurological:     General: No focal deficit present.     Mental Status: He is alert.  Psychiatric:        Mood and Affect: Mood normal.    ED Results / Procedures / Treatments   Labs (all labs ordered are listed, but only abnormal results are displayed) Labs Reviewed  COMPREHENSIVE METABOLIC PANEL - Abnormal; Notable for the following components:      Result Value   Chloride 96 (*)    Glucose, Bld 237 (*)    Creatinine, Ser 1.26 (*)    All other components within normal limits  CBC - Abnormal; Notable for the following components:   Hemoglobin 17.3 (*)    All other components within normal limits  RESP PANEL BY RT-PCR (FLU A&B, COVID) ARPGX2  LIPASE, BLOOD  URINALYSIS, ROUTINE W REFLEX MICROSCOPIC    EKG None  Radiology CT Angio Abd/Pel W and/or Wo Contrast  Result Date: 08/30/2021 CLINICAL DATA:  Acute generalized abdominal pain, severe cramping, vomiting EXAM: CT ANGIOGRAPHY ABDOMEN AND PELVIS WITH CONTRAST AND WITHOUT CONTRAST TECHNIQUE: Multidetector CT imaging of the  abdomen and pelvis was performed using the standard protocol during bolus administration of intravenous contrast. Multiplanar reconstructed images and MIPs were obtained and reviewed to evaluate the vascular anatomy. CONTRAST:  127mL OMNIPAQUE IOHEXOL 350 MG/ML SOLN COMPARISON:  06/01/2020 FINDINGS: VASCULAR Aorta: Infrarenal aorta and iliac bifurcation atherosclerosis noted without aneurysm, dissection, occlusive process, or acute aortic abnormality. No retroperitoneal hemorrhage, hematoma, evidence of rupture, or surrounding inflammatory process. Celiac: Minor atherosclerotic change. Widely patent origin including its branches. SMA: Origin atherosclerotic change without significant stenosis or occlusion.  SMA remains patent including its branches in the central mesentery. Renals: Widely patent main renal arteries. No renal artery stenosis. No accessory renal artery appreciated. IMA: Remains patent off the distal aorta including its branches. Inflow: Common, internal and external iliac arteries all remain patent. No inflow disease or iliac occlusion. Minor atherosclerotic change. Proximal Outflow: Common femoral, proximal profunda femoral, proximal superficial femoral arteries all remain patent. Veins: No veno-occlusive process. Review of the MIP images confirms the above findings. NON-VASCULAR Lower chest: Minor basilar atelectasis. Normal heart size. No pericardial pleural effusion. Thoracic aortic atherosclerosis. Hepatobiliary: Focal fatty infiltration of the liver along the falciform ligament anteriorly. No biliary dilatation or other focal hepatic abnormality. Gallbladder nondistended. Common bile duct nondilated. Pancreas: Unremarkable. No pancreatic ductal dilatation or surrounding inflammatory changes. Spleen: Normal in size without focal abnormality. Adrenals/Urinary Tract: Normal adrenal glands. Right kidney lower pole 2 cm renal cyst noted. Left kidney lower pole cortical atrophy and punctate 3 mm  nonobstructing calculus noted. No renal obstruction pattern or hydronephrosis. Ureters are symmetric and decompressed. Urinary bladder unremarkable. Stomach/Bowel: Previous gastric sleeve surgery. Dilated duodenum as well as several loops of proximal and mid jejunum in the central abdomen. Dilated loops have air-fluid levels and angulation. Fecalization of the small bowel noted where there is a transition point to collapsed small bowel, image 69/10. Findings compatible with small-bowel obstruction without clear etiology but favored to be related to adhesions. No definitive bowel wall thickening. No pneumatosis or free air. No portal venous air. No free fluid, fluid collection, hemorrhage, hematoma, or abscess. Lymphatic: No bulky adenopathy. Reproductive: No significant finding by CT. Other: No ascites. Postop changes of the abdominal wall with muscular atrophy and diastasis. No herniation. Musculoskeletal: Degenerative changes of the spine. Multilevel lumbar facet arthropathy. No acute osseous finding. IMPRESSION: VASCULAR Aortoiliac atherosclerosis as well abdominal atherosclerosis but negative for mesenteric occlusive disease. Mesenteric and renal vasculature all remain patent. No acute intra-abdominal or pelvic vascular process. NON-VASCULAR Postop changes of previous gastric sleeve surgery with dilated proximal small bowel including the duodenum and jejunum with dilated loops showing angulation and fecalization. Findings compatible with small-bowel obstruction suspect related to adhesions. No evidence of abscess or perforation. Electronically Signed   By: Jerilynn Mages.  Shick M.D.   On: 08/30/2021 11:57    Procedures Procedures   Medications Ordered in ED Medications  0.9 %  sodium chloride infusion ( Intravenous New Bag/Given 08/30/21 1252)  iohexol (OMNIPAQUE) 350 MG/ML injection 100 mL (100 mLs Intravenous Contrast Given 08/30/21 1120)    ED Course  I have reviewed the triage vital signs and the nursing  notes.  Pertinent labs & imaging results that were available during my care of the patient were reviewed by me and considered in my medical decision making (see chart for details).    MDM Rules/Calculators/A&P                           70 year old presented to ER with concern for abdominal pain, nausea and vomiting.  On exam he appears well in no distress, did have generalized tenderness.  Given the pain worsening with p.o., checked CTA abdomen pelvis to evaluate for mesenteric ischemia versus bowel obstruction.  No mesenteric occlusive disease noted.  Was found to have small bowel obstruction, suspect related to adhesions.  Discussed case with Dr. Ninfa Linden on-call for general surgery.  As patient does not have any active symptoms, okay to defer NG tube however if patient has become more symptomatic,  would recommend NG tube placement.  Recommends bowel rest, admission to hospitalist.  Their team will see as consult.  Discussed case with Dr. Marylyn Ishihara with [facility].  Will admit.  Patient updated throughout.  Started MIVF, n.p.o. for now.  Final Clinical Impression(s) / ED Diagnoses Final diagnoses:  SBO (small bowel obstruction) ([place])    Rx / DC Orders ED Discharge Orders     None        Lucrezia Starch, MD 08/30/21 1341

## 2021-08-30 NOTE — ED Triage Notes (Signed)
Pt c/o mid lower abdominal pain since Friday. Last normal BM Thursday morning, complains of vomiting since Friday. Unable to tolerate PO. Hx of bowel obstruction.

## 2021-08-30 NOTE — Plan of Care (Signed)

## 2021-08-30 NOTE — Progress Notes (Signed)
Notified by EDP of need for admission d/t SBO. [facility] accepts patient to med-surg at [facility]. EDP is to remain responsible for orders/medical decisions while patient is holding at [facility]. Upon arrival to [facility], [facility] will assume care. Nursing staff will call patient placement to notify them of patient's arrival so that the proper [facility] member may receive the patient. Nursing staff will notify the following consultants, General surgery (Dr. Ninfa Linden), of patient's arrival for their evaluation. Thank you.

## 2021-08-30 NOTE — H&P (Signed)
History and Physical    Dylan Reyes SFK:[id number] DOB: 1950/11/26 DOA: 08/30/2021  PCP: Alroy Dust, L.Marlou Sa, MD  Patient coming from: Home  Chief Complaint: abdominal pain/N/V  HPI: Dylan Reyes is a 70 y.o. male with medical history significant of DM, hypothyroidism, HTN, HLD, Gastric sleeve, recurrent abdominal adhesions. Presenting with stomach pain. Symptoms started 2 nights ago. He had crampy stomach pain in the lower quadrants. It came in 5 minute episodes. Vomiting made it better. He had no hematemesis. His nausea has been intermittent. When his symptoms did not improve, he decided to go to the ED. He denies any other aggravating or alleviating factors.    ED Course: CT showed an SBO w/ transition point. General surgery was consulted. [facility] was called for admission.   Review of Systems:  Review of systems is otherwise negative for all not mentioned in HPI.   PMHx Past Medical History:  Diagnosis Date   Bowel obstruction ([place])    Diabetes ([place])    Dr. Chalmers Cater   Hypercholesteremia    Hypertension    Thyroid cancer [facility])    Thyroid disease    thyroid cancer 09/02 with radioactive iodine 7/06     PSHx Past Surgical History:  Procedure Laterality Date   ABDOMINAL SURGERY     COLON SURGERY     HERNIA REPAIR     LAPAROSCOPIC GASTRIC SLEEVE RESECTION     THYROIDECTOMY      SocHx  reports that he has never smoked. He has never used smokeless tobacco. He reports current alcohol use. He reports that he does not use drugs.  Allergies  Allergen Reactions   Ciprofloxacin     FamHx History reviewed. No pertinent family history.  Prior to Admission medications   Medication Sig Start Date End Date Taking? Authorizing Provider  aspirin 81 MG tablet Take 81 mg by mouth daily.   Yes [provider]  co-enzyme Q-10 30 MG capsule Take 30 mg by mouth 3 (three) times daily.   Yes [provider]  empagliflozin (JARDIANCE) 25 MG TABS tablet Take by mouth daily.    Yes [provider]  levothyroxine (SYNTHROID, LEVOTHROID) 200 MCG tablet Take 200 mcg by mouth daily before breakfast.   Yes [provider]  lisinopril (PRINIVIL,ZESTRIL) 20 MG tablet Take 20 mg by mouth daily.   Yes [provider]  metFORMIN (GLUCOPHAGE) 1000 MG tablet Take 1,000 mg by mouth 2 (two) times daily with a meal.   Yes [provider]  Multiple Vitamin (MULTIVITAMIN) capsule Take 1 capsule by mouth daily.   Yes [provider]  Omega-3 Fatty Acids (FISH OIL) 1000 MG CAPS Take by mouth.   Yes [provider]  rosuvastatin (CRESTOR) 10 MG tablet Take 10 mg by mouth daily.   Yes [provider]  Turmeric (QC TUMERIC COMPLEX PO) Take by mouth.   Yes [provider]  zinc gluconate 50 MG tablet Take 50 mg by mouth daily.   Yes [provider]  hydrochlorothiazide (MICROZIDE) 12.5 MG capsule Take 12.5 mg by mouth daily.    [provider]  oxyCODONE (ROXICODONE) 5 MG immediate release tablet Take 1 tablet (5 mg total) by mouth every 6 (six) hours as needed for up to 20 doses for breakthrough pain. 06/01/20   Curatolo, Adam, DO  simvastatin (ZOCOR) 40 MG tablet Take 40 mg by mouth daily.    [provider]  sulfamethoxazole-trimethoprim (BACTRIM DS,SEPTRA DS) 800-160 MG tablet Take 1 tablet by mouth  2 (two) times daily. 04/22/16   Tanna Furry, MD    Physical Exam: Vitals:   08/30/21 1115 08/30/21 1321 08/30/21 1333 08/30/21 1457  BP: 125/83  (!) 145/82 (!) 148/74  Pulse: 89 79 80 80  Resp: 20 18 18 16   Temp:    99.6 F (37.6 C)  TempSrc:    Oral  SpO2: 93% 98% 97% 98%  Weight:      Height:        General: 70 y.o. male resting in bed in NAD Eyes: PERRL, normal sclera ENMT: Nares patent w/o discharge, orophaynx clear, dentition normal, ears w/o discharge/lesions/ulcers Neck: Supple, trachea midline Cardiovascular: RRR, +S1, S2, no m/g/r, equal pulses throughout Respiratory: CTABL,  no w/r/r, normal WOB GI: BS hypoactive, TTP RLQ/LLQ, distended but soft, no masses noted, no organomegaly noted MSK: No e/c/c Neuro: A&O x 3, no focal deficits Psyc: Appropriate interaction and affect, calm/cooperative  Labs on Admission: I have personally reviewed following labs and imaging studies  CBC: Recent Labs  Lab 08/30/21 1033  WBC 8.8  HGB 17.3*  HCT 51.3  MCV 94.0  PLT 194   Basic Metabolic Panel: Recent Labs  Lab 08/30/21 1033  NA 138  K 4.0  CL 96*  CO2 27  GLUCOSE 237*  BUN 21  CREATININE 1.26*  CALCIUM 10.0   GFR: Estimated Creatinine Clearance: 68.5 mL/min (A) (by C-G formula based on SCr of 1.26 mg/dL (H)). Liver Function Tests: Recent Labs  Lab 08/30/21 1033  AST 20  ALT 19  ALKPHOS 69  BILITOT 1.2  PROT 8.1  ALBUMIN 4.4   Recent Labs  Lab 08/30/21 1033  LIPASE 27   No results for input(s): AMMONIA in the last 168 hours. Coagulation Profile: No results for input(s): INR, PROTIME in the last 168 hours. Cardiac Enzymes: No results for input(s): CKTOTAL, CKMB, CKMBINDEX, TROPONINI in the last 168 hours. BNP (last 3 results) No results for input(s): PROBNP in the last 8760 hours. HbA1C: No results for input(s): HGBA1C in the last 72 hours. CBG: No results for input(s): GLUCAP in the last 168 hours. Lipid Profile: No results for input(s): CHOL, HDL, LDLCALC, TRIG, CHOLHDL, LDLDIRECT in the last 72 hours. Thyroid Function Tests: No results for input(s): TSH, T4TOTAL, FREET4, T3FREE, THYROIDAB in the last 72 hours. Anemia Panel: No results for input(s): VITAMINB12, FOLATE, FERRITIN, TIBC, IRON, RETICCTPCT in the last 72 hours. Urine analysis:    Component Value Date/Time   COLORURINE YELLOW 06/01/2020 0935   APPEARANCEUR CLEAR 06/01/2020 0935   LABSPEC 1.025 06/01/2020 0935   PHURINE 5.5 06/01/2020 0935   GLUCOSEU 100 (A) 06/01/2020 0935   HGBUR TRACE (A) 06/01/2020 0935   BILIRUBINUR NEGATIVE 06/01/2020 0935   KETONESUR NEGATIVE  06/01/2020 0935   PROTEINUR NEGATIVE 06/01/2020 0935   NITRITE NEGATIVE 06/01/2020 0935   LEUKOCYTESUR NEGATIVE 06/01/2020 0935    Radiological Exams on Admission: CT Angio Abd/Pel W and/or Wo Contrast  Result Date: 08/30/2021 CLINICAL DATA:  Acute generalized abdominal pain, severe cramping, vomiting EXAM: CT ANGIOGRAPHY ABDOMEN AND PELVIS WITH CONTRAST AND WITHOUT CONTRAST TECHNIQUE: Multidetector CT imaging of the abdomen and pelvis was performed using the standard protocol during bolus administration of intravenous contrast. Multiplanar reconstructed images and MIPs were obtained and reviewed to evaluate the vascular anatomy. CONTRAST:  149mL OMNIPAQUE IOHEXOL 350 MG/ML SOLN COMPARISON:  06/01/2020 FINDINGS: VASCULAR Aorta: Infrarenal aorta and iliac bifurcation atherosclerosis noted without aneurysm, dissection, occlusive process, or acute aortic abnormality. No retroperitoneal hemorrhage, hematoma, evidence of rupture,  or surrounding inflammatory process. Celiac: Minor atherosclerotic change. Widely patent origin including its branches. SMA: Origin atherosclerotic change without significant stenosis or occlusion. SMA remains patent including its branches in the central mesentery. Renals: Widely patent main renal arteries. No renal artery stenosis. No accessory renal artery appreciated. IMA: Remains patent off the distal aorta including its branches. Inflow: Common, internal and external iliac arteries all remain patent. No inflow disease or iliac occlusion. Minor atherosclerotic change. Proximal Outflow: Common femoral, proximal profunda femoral, proximal superficial femoral arteries all remain patent. Veins: No veno-occlusive process. Review of the MIP images confirms the above findings. NON-VASCULAR Lower chest: Minor basilar atelectasis. Normal heart size. No pericardial pleural effusion. Thoracic aortic atherosclerosis. Hepatobiliary: Focal fatty infiltration of the liver along the falciform  ligament anteriorly. No biliary dilatation or other focal hepatic abnormality. Gallbladder nondistended. Common bile duct nondilated. Pancreas: Unremarkable. No pancreatic ductal dilatation or surrounding inflammatory changes. Spleen: Normal in size without focal abnormality. Adrenals/Urinary Tract: Normal adrenal glands. Right kidney lower pole 2 cm renal cyst noted. Left kidney lower pole cortical atrophy and punctate 3 mm nonobstructing calculus noted. No renal obstruction pattern or hydronephrosis. Ureters are symmetric and decompressed. Urinary bladder unremarkable. Stomach/Bowel: Previous gastric sleeve surgery. Dilated duodenum as well as several loops of proximal and mid jejunum in the central abdomen. Dilated loops have air-fluid levels and angulation. Fecalization of the small bowel noted where there is a transition point to collapsed small bowel, image 69/10. Findings compatible with small-bowel obstruction without clear etiology but favored to be related to adhesions. No definitive bowel wall thickening. No pneumatosis or free air. No portal venous air. No free fluid, fluid collection, hemorrhage, hematoma, or abscess. Lymphatic: No bulky adenopathy. Reproductive: No significant finding by CT. Other: No ascites. Postop changes of the abdominal wall with muscular atrophy and diastasis. No herniation. Musculoskeletal: Degenerative changes of the spine. Multilevel lumbar facet arthropathy. No acute osseous finding. IMPRESSION: VASCULAR Aortoiliac atherosclerosis as well abdominal atherosclerosis but negative for mesenteric occlusive disease. Mesenteric and renal vasculature all remain patent. No acute intra-abdominal or pelvic vascular process. NON-VASCULAR Postop changes of previous gastric sleeve surgery with dilated proximal small bowel including the duodenum and jejunum with dilated loops showing angulation and fecalization. Findings compatible with small-bowel obstruction suspect related to adhesions.  No evidence of abscess or perforation. Electronically Signed   By: Jerilynn Mages.  Shick M.D.   On: 08/30/2021 11:57    EKG: Independently reviewed. Sinus, no st elevations  Assessment/Plan SBO N/V     - placed in obs, med-surg     - general surgery consulted; appreciate assistance     - NPO     - SBO protocol initiated, place NGT     - anti-emetics, fluids  DM2     - SSI, check A1c, glucose checks  HTN     - PRN meds available     - resume home regimen when he is able to take PO  HLD     - resume home regimen when he is able to take PO  Hypothyroidism     -  resume home regimen when he is able to take PO  DVT prophylaxis: SCDs  Code Status: FULL  Family Communication: w/ wife at bedside  Consults called: General surgery   Status is: Observation  The patient remains OBS appropriate and will d/c before 2 midnights.  Jonnie Finner DO [facility]  If 7PM-7AM, please contact night-coverage [email or web address]  08/30/2021, 3:28 PM

## 2021-08-31 ENCOUNTER — Observation Stay (HOSPITAL_COMMUNITY): Payer: Medicare Other

## 2021-08-31 DIAGNOSIS — E039 Hypothyroidism, unspecified: Secondary | ICD-10-CM

## 2021-08-31 DIAGNOSIS — E119 Type 2 diabetes mellitus without complications: Secondary | ICD-10-CM

## 2021-08-31 DIAGNOSIS — I10 Essential (primary) hypertension: Secondary | ICD-10-CM

## 2021-08-31 DIAGNOSIS — K56609 Unspecified intestinal obstruction, unspecified as to partial versus complete obstruction: Secondary | ICD-10-CM | POA: Diagnosis not present

## 2021-08-31 DIAGNOSIS — E785 Hyperlipidemia, unspecified: Secondary | ICD-10-CM

## 2021-08-31 LAB — COMPREHENSIVE METABOLIC PANEL
ALT: 16 U/L (ref 0–44)
AST: 17 U/L (ref 15–41)
Albumin: 4.3 g/dL (ref 3.5–5.0)
Alkaline Phosphatase: 64 U/L (ref 38–126)
Anion gap: 18 — ABNORMAL HIGH (ref 5–15)
BUN: 28 mg/dL — ABNORMAL HIGH (ref 8–23)
CO2: 21 mmol/L — ABNORMAL LOW (ref 22–32)
Calcium: 10.1 mg/dL (ref 8.9–10.3)
Chloride: 101 mmol/L (ref 98–111)
Creatinine, Ser: 1.16 mg/dL (ref 0.61–1.24)
GFR, Estimated: >60 mL/min (ref >60)
Glucose, Bld: 182 mg/dL — ABNORMAL HIGH (ref 70–99)
Potassium: 4.3 mmol/L (ref 3.5–5.1)
Sodium: 140 mmol/L (ref 135–145)
Total Bilirubin: 1.6 mg/dL — ABNORMAL HIGH (ref 0.3–1.2)
Total Protein: 7.7 g/dL (ref 6.5–8.1)

## 2021-08-31 LAB — CBC
HCT: 52.5 % — ABNORMAL HIGH (ref 39.0–52.0)
Hemoglobin: 17.3 g/dL — ABNORMAL HIGH (ref 13.0–17.0)
MCH: 31.6 pg (ref 26.0–34.0)
MCHC: 33 g/dL (ref 30.0–36.0)
MCV: 95.8 fL (ref 80.0–100.0)
Platelets: 320 10*3/uL (ref 150–400)
RBC: 5.48 MIL/uL (ref 4.22–5.81)
RDW: 12.6 % (ref 11.5–15.5)
WBC: 9.1 10*3/uL (ref 4.0–10.5)
nRBC: 0 % (ref 0.0–0.2)

## 2021-08-31 LAB — HIV ANTIBODY (ROUTINE TESTING W REFLEX): HIV Screen 4th Generation wRfx: NONREACTIVE

## 2021-08-31 LAB — HEMOGLOBIN A1C
Hgb A1c MFr Bld: 7.1 % — ABNORMAL HIGH (ref 4.8–5.6)
Mean Plasma Glucose: 157.07 mg/dL

## 2021-08-31 LAB — GLUCOSE, CAPILLARY
Glucose-Capillary: 155 mg/dL — ABNORMAL HIGH (ref 70–99)
Glucose-Capillary: 163 mg/dL — ABNORMAL HIGH (ref 70–99)

## 2021-08-31 MED ORDER — HYDRALAZINE HCL 25 MG PO TABS: 25.0000 mg | ORAL_TABLET | ORAL | Status: DC | PRN

## 2021-08-31 MED ORDER — GLUCERNA SHAKE PO LIQD
237.0000 mL | Freq: Three times a day (TID) | ORAL | Status: DC
  Filled 2021-08-31 (×3): qty 237

## 2021-08-31 MED ORDER — LISINOPRIL 20 MG PO TABS
20.0000 mg | ORAL_TABLET | Freq: Every day | ORAL | Status: DC
  Administered 2021-08-31: 20 mg via ORAL
  Filled 2021-08-31: qty 1

## 2021-08-31 MED ORDER — LABETALOL HCL 5 MG/ML IV SOLN
10.0000 mg | INTRAVENOUS | Status: DC | PRN
  Filled 2021-08-31: qty 4

## 2021-08-31 NOTE — Assessment & Plan Note (Signed)
-   resume synthroid tomorrow

## 2021-08-31 NOTE — Assessment & Plan Note (Signed)
-   History of gastric sleeve surgery and SBO due to underlying adhesions - Presents with nausea, vomiting, abdominal pain - CT concerning for recurrent SBO -General surgery following, appreciate assistance - Symptoms had improved this morning and NG tube was removed per surgery and patient started on full liquid diet.

## 2021-08-31 NOTE — Progress Notes (Signed)
Progress Note    Dylan Reyes   EVO:[id number]  DOB: 10-29-1951  DOA: 08/30/2021     0 Date of Service: 08/31/2021   Clinical Course Dylan Reyes is a 70 yo male with PMH gastric sleeve, recurrent SBOs, DMII, HTN, HLD, thyroid cancer who presented to the hospital with abdominal pain and nausea/vomiting.  Concern was for recurrent SBO given his history and he underwent CT angio abdomen/pelvis on work-up.  He was found to have dilated proximal small bowel including duodenum and jejunum.  Findings were consistent with SBO with suspicion due to underlying adhesions. Due to his vomiting, an NG tube was placed for decompression and he was admitted for further work-up and evaluation as well as general surgery consult.   Assessment and Plan * SBO (small bowel obstruction) (HCC) - History of gastric sleeve surgery and SBO due to underlying adhesions - Presents with nausea, vomiting, abdominal pain - CT concerning for recurrent SBO -General surgery following, appreciate assistance - Symptoms had improved this morning and NG tube was removed per surgery and patient started on full liquid diet.  Hypothyroidism - resume synthroid tomorrow   HLD (hyperlipidemia) - will resume statin at discharge   Hypertension - BP mildly elevated - now that FLD started, will resume lisinopril   DMII (diabetes mellitus, type 2) (HCC) - continue SSI and CBG monitoring     Subjective:  Seen this morning walking the hallway comfortably.  Abdominal soreness has improved in his lower abdomen.  NG tube had already been removed and he had no further nausea or vomiting.  Having flatus but still no bowel movement.  Last bowel movement was on Thursday and last meal was on Friday.  Objective Vitals:   08/30/21 1731 08/30/21 2117 08/31/21 0113 08/31/21 0539  BP: (!) 153/90 (!) 161/79 (!) 154/82 (!) 156/79  Pulse: 90 90 90 86  Resp: 18 18 19 19   Temp: 99.3 F (37.4 C) 98.3 F (36.8 C) 98.1 F (36.7 C) 98.2 F  (36.8 C)  TempSrc: Oral Oral Oral Oral  SpO2: 96% 96% 94% 94%  Weight:      Height:       109.8 kg  Vital signs were reviewed and unremarkable.   Exam Physical Exam Constitutional:      General: He is not in acute distress.    Appearance: He is well-developed. He is not ill-appearing.  HENT:     Head: Normocephalic and atraumatic.  Eyes:     Extraocular Movements: Extraocular movements intact.  Cardiovascular:     Rate and Rhythm: Normal rate and regular rhythm.  Pulmonary:     Effort: Pulmonary effort is normal.     Breath sounds: Normal breath sounds. No wheezing.  Abdominal:     General: Bowel sounds are normal. There is no distension.     Palpations: Abdomen is soft.     Tenderness: There is no abdominal tenderness.     Comments: Abdominal hernia noted  Musculoskeletal:     Cervical back: Normal range of motion and neck supple.  Skin:    General: Skin is warm and dry.  Neurological:     General: No focal deficit present.     Mental Status: He is alert.  Psychiatric:        Mood and Affect: Mood normal.        Behavior: Behavior normal.     Labs / Other Information My review of labs, imaging, notes and other tests shows no new significant findings.  Disposition Plan: Status is: Observation  The patient will require care spanning > 2 midnights and should be moved to inpatient because: monitoring for tolerance of diet and return of bowel function       Time spent: Greater than 50% of the 35 minute visit was spent in counseling/coordination of care for the patient as laid out in the A&P.  Dwyane Dee, MD [facility] 08/31/2021, 11:34 AM

## 2021-08-31 NOTE — Assessment & Plan Note (Signed)
-   continue SSI and CBG monitoring  

## 2021-08-31 NOTE — Hospital Course (Addendum)
Mr. Lightsey is a 70 yo male with PMH gastric sleeve, recurrent SBOs, DMII, HTN, HLD, thyroid cancer who presented to the hospital with abdominal pain and nausea/vomiting.  Concern was for recurrent SBO given his history and he underwent CT angio abdomen/pelvis on work-up.  He was found to have dilated proximal small bowel including duodenum and jejunum.  Findings were consistent with SBO with suspicion due to underlying adhesions. Due to his vomiting, an NG tube was placed for decompression and he was admitted for further work-up and evaluation as well as general surgery consult.  He had good clinical improvement with supportive care and was reevaluated by surgery and cleared for discharging home.  He will advance his diet slowly upon discharge also.

## 2021-08-31 NOTE — Assessment & Plan Note (Signed)
-   will resume statin at discharge

## 2021-08-31 NOTE — Assessment & Plan Note (Addendum)
-   BP mildly elevated - now that FLD started, will resume lisinopril

## 2021-08-31 NOTE — Discharge Summary (Signed)
Physician Discharge Summary   Patient name: Dylan Reyes  Admit date:     08/30/2021  Discharge date: 08/31/2021  Discharge Physician: Dwyane Dee   PCP: Alroy Dust, L.Marlou Sa, MD   Recommendations at discharge:  Continue advancing diet slowly upon discharge  Discharge Diagnoses Principal Problem:   SBO (small bowel obstruction) ([place]) Active Problems:   DMII (diabetes mellitus, type 2) ([place])   Hypertension   HLD (hyperlipidemia)   Hypothyroidism   Resolved Diagnoses Resolved Problems:   * No resolved hospital problems. [facility] Course   [name] is a 70 yo male with PMH gastric sleeve, recurrent SBOs, DMII, HTN, HLD, thyroid cancer who presented to the hospital with abdominal pain and nausea/vomiting.  Concern was for recurrent SBO given his history and he underwent CT angio abdomen/pelvis on work-up.  He was found to have dilated proximal small bowel including duodenum and jejunum.  Findings were consistent with SBO with suspicion due to underlying adhesions. Due to his vomiting, an NG tube was placed for decompression and he was admitted for further work-up and evaluation as well as general surgery consult.  He had good clinical improvement with supportive care and was reevaluated by surgery and cleared for discharging home.  He will advance his diet slowly upon discharge also.   * SBO (small bowel obstruction) (HCC) - History of gastric sleeve surgery and SBO due to underlying adhesions - Presents with nausea, vomiting, abdominal pain - CT concerning for recurrent SBO -General surgery following, appreciate assistance - Symptoms had improved this morning and NG tube was removed per surgery and patient started on full liquid diet. -Reevaluated by surgery later in the day and cleared for discharging home     Procedures performed:    Condition at discharge: stable  Exam Physical Exam Constitutional:      General: He is not in acute distress.    Appearance: He  is well-developed. He is not ill-appearing.  HENT:     Head: Normocephalic and atraumatic.  Eyes:     Extraocular Movements: Extraocular movements intact.  Cardiovascular:     Rate and Rhythm: Normal rate and regular rhythm.  Pulmonary:     Effort: Pulmonary effort is normal.     Breath sounds: Normal breath sounds. No wheezing.  Abdominal:     General: Bowel sounds are normal. There is no distension.     Palpations: Abdomen is soft.     Tenderness: There is no abdominal tenderness.     Comments: Abdominal hernia noted  Musculoskeletal:     Cervical back: Normal range of motion and neck supple.  Skin:    General: Skin is warm and dry.  Neurological:     General: No focal deficit present.     Mental Status: He is alert.  Psychiatric:        Mood and Affect: Mood normal.        Behavior: Behavior normal.    Disposition: Home  Discharge time: greater than 30 minutes.  Follow-up Information     Alroy Dust, L.Marlou Sa, MD Follow up.   Specialty: Family Medicine Contact information: [address] [place] [address] [phone]                 Allergies as of 08/31/2021       Reactions   Ciprofloxacin Rash        Medication List     STOP taking these medications    oxyCODONE 5 MG immediate release  tablet Commonly known as: Roxicodone   sulfamethoxazole-trimethoprim 800-160 MG tablet Commonly known as: BACTRIM DS       TAKE these medications    acetaminophen 500 MG tablet Commonly known as: TYLENOL Take 500-1,000 mg by mouth every 6 (six) hours as needed for mild pain or headache.   aspirin 81 MG tablet Take 81 mg by mouth daily.   co-enzyme Q-10 30 MG capsule Take 30 mg by mouth in the morning.   Fish Oil 1000 MG Caps Take 1,000 mg by mouth daily.   Jardiance 25 MG Tabs tablet Generic drug: empagliflozin Take 25 mg by mouth in the morning.   levothyroxine 112 MCG tablet Commonly known as: SYNTHROID Take 112 mcg by mouth  daily before breakfast.   lisinopril 20 MG tablet Commonly known as: ZESTRIL Take 20 mg by mouth daily.   metFORMIN 500 MG 24 hr tablet Commonly known as: GLUCOPHAGE-XR Take 1,000 mg by mouth in the morning and at bedtime.   multivitamin capsule Take 1 capsule by mouth daily.   QC TUMERIC COMPLEX PO Take 1 capsule by mouth daily.   rosuvastatin 10 MG tablet Commonly known as: CRESTOR Take 10 mg by mouth daily.   zinc gluconate 50 MG tablet Take 50 mg by mouth daily.        DG Abd Portable 1V-Small Bowel Obstruction Protocol-initial, 8 hr delay  Result Date: 08/31/2021 CLINICAL DATA:  Follow up small bowel obstruction EXAM: PORTABLE ABDOMEN - 1 VIEW COMPARISON:  Film from the previous day. FINDINGS: Gastric catheter is noted within the stomach. Administered contrast now lies in the distal small bowel with normal caliber as well as the proximal colon consistent with a partial small bowel obstruction. Mildly dilated loops of small bowel are noted similar to that seen on prior CT. IMPRESSION: Administered contrast now lies predominately within the distal small bowel and proximal colon consistent with a partial small bowel obstruction. Persistent small bowel dilatation is noted. Electronically Signed   By: Inez Catalina M.D.   On: 08/31/2021 02:32   DG Abd Portable 1V-Small Bowel Protocol-Position Verification  Result Date: 08/30/2021 CLINICAL DATA:  NG tube placement EXAM: PORTABLE ABDOMEN - 1 VIEW COMPARISON:  None. FINDINGS: NG tube tip is in the stomach. IMPRESSION: NG tube in the stomach. Electronically Signed   By: Rolm Baptise M.D.   On: 08/30/2021 18:17   CT Angio Abd/Pel W and/or Wo Contrast  Result Date: 08/30/2021 CLINICAL DATA:  Acute generalized abdominal pain, severe cramping, vomiting EXAM: CT ANGIOGRAPHY ABDOMEN AND PELVIS WITH CONTRAST AND WITHOUT CONTRAST TECHNIQUE: Multidetector CT imaging of the abdomen and pelvis was performed using the standard protocol during  bolus administration of intravenous contrast. Multiplanar reconstructed images and MIPs were obtained and reviewed to evaluate the vascular anatomy. CONTRAST:  182mL OMNIPAQUE IOHEXOL 350 MG/ML SOLN COMPARISON:  06/01/2020 FINDINGS: VASCULAR Aorta: Infrarenal aorta and iliac bifurcation atherosclerosis noted without aneurysm, dissection, occlusive process, or acute aortic abnormality. No retroperitoneal hemorrhage, hematoma, evidence of rupture, or surrounding inflammatory process. Celiac: Minor atherosclerotic change. Widely patent origin including its branches. SMA: Origin atherosclerotic change without significant stenosis or occlusion. SMA remains patent including its branches in the central mesentery. Renals: Widely patent main renal arteries. No renal artery stenosis. No accessory renal artery appreciated. IMA: Remains patent off the distal aorta including its branches. Inflow: Common, internal and external iliac arteries all remain patent. No inflow disease or iliac occlusion. Minor atherosclerotic change. Proximal Outflow: Common femoral, proximal profunda femoral, proximal superficial femoral  arteries all remain patent. Veins: No veno-occlusive process. Review of the MIP images confirms the above findings. NON-VASCULAR Lower chest: Minor basilar atelectasis. Normal heart size. No pericardial pleural effusion. Thoracic aortic atherosclerosis. Hepatobiliary: Focal fatty infiltration of the liver along the falciform ligament anteriorly. No biliary dilatation or other focal hepatic abnormality. Gallbladder nondistended. Common bile duct nondilated. Pancreas: Unremarkable. No pancreatic ductal dilatation or surrounding inflammatory changes. Spleen: Normal in size without focal abnormality. Adrenals/Urinary Tract: Normal adrenal glands. Right kidney lower pole 2 cm renal cyst noted. Left kidney lower pole cortical atrophy and punctate 3 mm nonobstructing calculus noted. No renal obstruction pattern or  hydronephrosis. Ureters are symmetric and decompressed. Urinary bladder unremarkable. Stomach/Bowel: Previous gastric sleeve surgery. Dilated duodenum as well as several loops of proximal and mid jejunum in the central abdomen. Dilated loops have air-fluid levels and angulation. Fecalization of the small bowel noted where there is a transition point to collapsed small bowel, image 69/10. Findings compatible with small-bowel obstruction without clear etiology but favored to be related to adhesions. No definitive bowel wall thickening. No pneumatosis or free air. No portal venous air. No free fluid, fluid collection, hemorrhage, hematoma, or abscess. Lymphatic: No bulky adenopathy. Reproductive: No significant finding by CT. Other: No ascites. Postop changes of the abdominal wall with muscular atrophy and diastasis. No herniation. Musculoskeletal: Degenerative changes of the spine. Multilevel lumbar facet arthropathy. No acute osseous finding. IMPRESSION: VASCULAR Aortoiliac atherosclerosis as well abdominal atherosclerosis but negative for mesenteric occlusive disease. Mesenteric and renal vasculature all remain patent. No acute intra-abdominal or pelvic vascular process. NON-VASCULAR Postop changes of previous gastric sleeve surgery with dilated proximal small bowel including the duodenum and jejunum with dilated loops showing angulation and fecalization. Findings compatible with small-bowel obstruction suspect related to adhesions. No evidence of abscess or perforation. Electronically Signed   By: Jerilynn Mages.  Shick M.D.   On: 08/30/2021 11:57   Results for orders placed or performed during the hospital encounter of 08/30/21  Resp Panel by RT-PCR (Flu A&B, Covid) Nasopharyngeal Swab     Status: None   Collection Time: 08/30/21 12:33 PM   Specimen: Nasopharyngeal Swab; Nasopharyngeal(NP) swabs in vial transport medium  Result Value Ref Range Status   SARS Coronavirus 2 by RT PCR NEGATIVE NEGATIVE Final    Comment:  (NOTE) SARS-CoV-2 target nucleic acids are NOT DETECTED.  The SARS-CoV-2 RNA is generally detectable in upper respiratory specimens during the acute phase of infection. The lowest concentration of SARS-CoV-2 viral copies this assay can detect is 138 copies/mL. A negative result does not preclude SARS-Cov-2 infection and should not be used as the sole basis for treatment or other patient management decisions. A negative result may occur with  improper specimen collection/handling, submission of specimen other than nasopharyngeal swab, presence of viral mutation(s) within the areas targeted by this assay, and inadequate number of viral copies(<138 copies/mL). A negative result must be combined with clinical observations, patient history, and epidemiological information. The expected result is Negative.  Fact Sheet for Patients:  EntrepreneurPulse.com.au  Fact Sheet for Healthcare Providers:  IncredibleEmployment.be  This test is no t yet approved or cleared by the [place] FDA and  has been authorized for detection and/or diagnosis of SARS-CoV-2 by FDA under an Emergency Use Authorization (EUA). This EUA will remain  in effect (meaning this test can be used) for the duration of the COVID-19 declaration under Section 564(b)(1) of the Act, 21 U.S.C.section 360bbb-3(b)(1), unless the authorization is terminated  or revoked sooner.  Influenza A by PCR NEGATIVE NEGATIVE Final   Influenza B by PCR NEGATIVE NEGATIVE Final    Comment: (NOTE) The Xpert Xpress SARS-CoV-2/FLU/RSV plus assay is intended as an aid in the diagnosis of influenza from Nasopharyngeal swab specimens and should not be used as a sole basis for treatment. Nasal washings and aspirates are unacceptable for Xpert Xpress SARS-CoV-2/FLU/RSV testing.  Fact Sheet for Patients: EntrepreneurPulse.com.au  Fact Sheet for Healthcare  Providers: IncredibleEmployment.be  This test is not yet approved or cleared by the [place] FDA and has been authorized for detection and/or diagnosis of SARS-CoV-2 by FDA under an Emergency Use Authorization (EUA). This EUA will remain in effect (meaning this test can be used) for the duration of the COVID-19 declaration under Section 564(b)(1) of the Act, 21 U.S.C. section 360bbb-3(b)(1), unless the authorization is terminated or revoked.  Performed at [facility], [address] [place], [place] [address]     Signed:  Dwyane Dee MD.  [facility] 08/31/2021, 3:06 PM

## 2021-08-31 NOTE — Assessment & Plan Note (Signed)
-   History of gastric sleeve surgery and SBO due to underlying adhesions - Presents with nausea, vomiting, abdominal pain - CT concerning for recurrent SBO -General surgery following, appreciate assistance - Symptoms had improved this morning and NG tube was removed per surgery and patient started on full liquid diet. -Reevaluated by surgery later in the day and cleared for discharging home

## 2021-08-31 NOTE — Progress Notes (Addendum)
Subjective: CC: Since NGT placement yesterday patient reports his nausea and abdominal pain have resolved. He has had < 1L from NGT since placement. He began passing flatus yesterday and has passed more frequent episodes this am. No BM.  Mobilizing in the room.   Objective: Vital signs in last 24 hours: Temp:  [97.7 F (36.5 C)-99.6 F (37.6 C)] 98.2 F (36.8 C) (10/17 0539) Pulse Rate:  [79-92] 86 (10/17 0539) Resp:  [16-20] 19 (10/17 0539) BP: (125-161)/(74-91) 156/79 (10/17 0539) SpO2:  [93 %-98 %] 94 % (10/17 0539) Weight:  [109.8 kg] 109.8 kg (10/16 1014) Last BM Date: 08/27/21  Intake/Output from previous day: 10/16 0701 - 10/17 0700 In: 484.3 [I.V.:484.3] Out: 1250 [Urine:1150; Emesis/NG output:100] Intake/Output this shift: No intake/output data recorded.  PE: Gen:  Alert, NAD, pleasant Card:  RRR Pulm:  CTAB, no W/R/R, effort normal Abd: Soft, ND, NT +BS, ventral/incisional hernia that is soft and reducible. NGT w/ bilious output, 500cc Ext:  No LE edema or calf tenderness Psych: A&Ox3  Skin: no rashes noted, warm and dry  Lab Results:  Recent Labs    08/30/21 1033 08/31/21 0445  WBC 8.8 9.1  HGB 17.3* 17.3*  HCT 51.3 52.5*  PLT 342 320   BMET Recent Labs    08/30/21 1033 08/31/21 0445  NA 138 140  K 4.0 4.3  CL 96* 101  CO2 27 21*  GLUCOSE 237* 182*  BUN 21 28*  CREATININE 1.26* 1.16  CALCIUM 10.0 10.1   PT/INR No results for input(s): LABPROT, INR in the last 72 hours. CMP     Component Value Date/Time   NA 140 08/31/2021 0445   K 4.3 08/31/2021 0445   CL 101 08/31/2021 0445   CO2 21 (L) 08/31/2021 0445   GLUCOSE 182 (H) 08/31/2021 0445   BUN 28 (H) 08/31/2021 0445   CREATININE 1.16 08/31/2021 0445   CALCIUM 10.1 08/31/2021 0445   PROT 7.7 08/31/2021 0445   ALBUMIN 4.3 08/31/2021 0445   AST 17 08/31/2021 0445   ALT 16 08/31/2021 0445   ALKPHOS 64 08/31/2021 0445   BILITOT 1.6 (H) 08/31/2021 0445   GFRNONAA >60 08/31/2021  0445   GFRAA >60 06/01/2020 0955   Lipase     Component Value Date/Time   LIPASE 27 08/30/2021 1033    Studies/Results: DG Abd Portable 1V-Small Bowel Obstruction Protocol-initial, 8 hr delay  Result Date: 08/31/2021 CLINICAL DATA:  Follow up small bowel obstruction EXAM: PORTABLE ABDOMEN - 1 VIEW COMPARISON:  Film from the previous day. FINDINGS: Gastric catheter is noted within the stomach. Administered contrast now lies in the distal small bowel with normal caliber as well as the proximal colon consistent with a partial small bowel obstruction. Mildly dilated loops of small bowel are noted similar to that seen on prior CT. IMPRESSION: Administered contrast now lies predominately within the distal small bowel and proximal colon consistent with a partial small bowel obstruction. Persistent small bowel dilatation is noted. Electronically Signed   By: Inez Catalina M.D.   On: 08/31/2021 02:32   DG Abd Portable 1V-Small Bowel Protocol-Position Verification  Result Date: 08/30/2021 CLINICAL DATA:  NG tube placement EXAM: PORTABLE ABDOMEN - 1 VIEW COMPARISON:  None. FINDINGS: NG tube tip is in the stomach. IMPRESSION: NG tube in the stomach. Electronically Signed   By: Rolm Baptise M.D.   On: 08/30/2021 18:17   CT Angio Abd/Pel W and/or Wo Contrast  Result Date: 08/30/2021 CLINICAL DATA:  Acute generalized abdominal pain, severe cramping, vomiting EXAM: CT ANGIOGRAPHY ABDOMEN AND PELVIS WITH CONTRAST AND WITHOUT CONTRAST TECHNIQUE: Multidetector CT imaging of the abdomen and pelvis was performed using the standard protocol during bolus administration of intravenous contrast. Multiplanar reconstructed images and MIPs were obtained and reviewed to evaluate the vascular anatomy. CONTRAST:  164mL OMNIPAQUE IOHEXOL 350 MG/ML SOLN COMPARISON:  06/01/2020 FINDINGS: VASCULAR Aorta: Infrarenal aorta and iliac bifurcation atherosclerosis noted without aneurysm, dissection, occlusive process, or acute aortic  abnormality. No retroperitoneal hemorrhage, hematoma, evidence of rupture, or surrounding inflammatory process. Celiac: Minor atherosclerotic change. Widely patent origin including its branches. SMA: Origin atherosclerotic change without significant stenosis or occlusion. SMA remains patent including its branches in the central mesentery. Renals: Widely patent main renal arteries. No renal artery stenosis. No accessory renal artery appreciated. IMA: Remains patent off the distal aorta including its branches. Inflow: Common, internal and external iliac arteries all remain patent. No inflow disease or iliac occlusion. Minor atherosclerotic change. Proximal Outflow: Common femoral, proximal profunda femoral, proximal superficial femoral arteries all remain patent. Veins: No veno-occlusive process. Review of the MIP images confirms the above findings. NON-VASCULAR Lower chest: Minor basilar atelectasis. Normal heart size. No pericardial pleural effusion. Thoracic aortic atherosclerosis. Hepatobiliary: Focal fatty infiltration of the liver along the falciform ligament anteriorly. No biliary dilatation or other focal hepatic abnormality. Gallbladder nondistended. Common bile duct nondilated. Pancreas: Unremarkable. No pancreatic ductal dilatation or surrounding inflammatory changes. Spleen: Normal in size without focal abnormality. Adrenals/Urinary Tract: Normal adrenal glands. Right kidney lower pole 2 cm renal cyst noted. Left kidney lower pole cortical atrophy and punctate 3 mm nonobstructing calculus noted. No renal obstruction pattern or hydronephrosis. Ureters are symmetric and decompressed. Urinary bladder unremarkable. Stomach/Bowel: Previous gastric sleeve surgery. Dilated duodenum as well as several loops of proximal and mid jejunum in the central abdomen. Dilated loops have air-fluid levels and angulation. Fecalization of the small bowel noted where there is a transition point to collapsed small bowel, image  69/10. Findings compatible with small-bowel obstruction without clear etiology but favored to be related to adhesions. No definitive bowel wall thickening. No pneumatosis or free air. No portal venous air. No free fluid, fluid collection, hemorrhage, hematoma, or abscess. Lymphatic: No bulky adenopathy. Reproductive: No significant finding by CT. Other: No ascites. Postop changes of the abdominal wall with muscular atrophy and diastasis. No herniation. Musculoskeletal: Degenerative changes of the spine. Multilevel lumbar facet arthropathy. No acute osseous finding. IMPRESSION: VASCULAR Aortoiliac atherosclerosis as well abdominal atherosclerosis but negative for mesenteric occlusive disease. Mesenteric and renal vasculature all remain patent. No acute intra-abdominal or pelvic vascular process. NON-VASCULAR Postop changes of previous gastric sleeve surgery with dilated proximal small bowel including the duodenum and jejunum with dilated loops showing angulation and fecalization. Findings compatible with small-bowel obstruction suspect related to adhesions. No evidence of abscess or perforation. Electronically Signed   By: Jerilynn Mages.  Shick M.D.   On: 08/30/2021 11:57    Anti-infectives: Anti-infectives (From admission, onward)    None        Assessment/Plan SBO - Hx of multiple prior surgeries. Suspect sbo from adhesions. - Contrast in colon on this AM films. Symptoms have improved and he has robf - D/C NGT. FLD - May be okay for d/c later today and slowly advance diet at home - Mobilize for bowel function - Keep K >4 and Mg > 2 for bowel function  FEN - D/c NGT. FLD, Glucerna shakes VTE - SCDs, okay for chemical prophylaxis from a general  surgery standpoint ID - None. Afebrile. WBC wnl Foley - None. Cr 1.16  HTN HLD Remote hx of thyroid cancer s/p thyroidectomy DM2 - A1c 7.1   LOS: 0 days    Jillyn Ledger , [facility] Surgery 08/31/2021, 9:01 AM Please see Amion for pager  number during day hours 7:00am-4:30pm

## 2021-08-31 NOTE — Progress Notes (Signed)
Discharge instructions given to patient and all questions were answered.  

## 2021-08-31 NOTE — Care Management Obs Status (Signed)
[place] NOTIFICATION   Patient Details  Name: Dylan Reyes MRN: [id number] Date of Birth: [birth year]   Medicare Observation Status Notification Given:  Macario Golds, LCSW 08/31/2021, 3:19 PM

## 2021-08-31 NOTE — Care Management CC44 (Signed)
Condition Code 44 Documentation Completed  Patient Details  Name: Dylan Reyes MRN: [id number] Date of Birth: 1951/05/02   Condition Code 44 given:  Yes Patient signature on Condition Code 44 notice:  Yes Documentation of 2 MD's agreement:  Yes Code 44 added to claim:  Yes    Kerina Simoneau, LCSW 08/31/2021, 3:19 PM

## 2022-01-20 DIAGNOSIS — E1165 Type 2 diabetes mellitus with hyperglycemia: Secondary | ICD-10-CM | POA: Diagnosis not present

## 2022-01-20 DIAGNOSIS — E7800 Pure hypercholesterolemia, unspecified: Secondary | ICD-10-CM | POA: Diagnosis not present

## 2022-01-20 DIAGNOSIS — C73 Malignant neoplasm of thyroid gland: Secondary | ICD-10-CM | POA: Diagnosis not present

## 2022-01-20 DIAGNOSIS — E890 Postprocedural hypothyroidism: Secondary | ICD-10-CM | POA: Diagnosis not present

## 2022-01-27 DIAGNOSIS — E1165 Type 2 diabetes mellitus with hyperglycemia: Secondary | ICD-10-CM | POA: Diagnosis not present

## 2022-01-27 DIAGNOSIS — E890 Postprocedural hypothyroidism: Secondary | ICD-10-CM | POA: Diagnosis not present

## 2022-01-27 DIAGNOSIS — E7800 Pure hypercholesterolemia, unspecified: Secondary | ICD-10-CM | POA: Diagnosis not present

## 2022-01-27 DIAGNOSIS — I10 Essential (primary) hypertension: Secondary | ICD-10-CM | POA: Diagnosis not present

## 2022-01-27 DIAGNOSIS — C73 Malignant neoplasm of thyroid gland: Secondary | ICD-10-CM | POA: Diagnosis not present

## 2022-01-27 DIAGNOSIS — Z9884 Bariatric surgery status: Secondary | ICD-10-CM | POA: Diagnosis not present

## 2022-05-05 DIAGNOSIS — E119 Type 2 diabetes mellitus without complications: Secondary | ICD-10-CM | POA: Diagnosis not present

## 2022-05-21 DIAGNOSIS — E1165 Type 2 diabetes mellitus with hyperglycemia: Secondary | ICD-10-CM | POA: Diagnosis not present

## 2022-05-21 DIAGNOSIS — E890 Postprocedural hypothyroidism: Secondary | ICD-10-CM | POA: Diagnosis not present

## 2022-07-09 ENCOUNTER — Ambulatory Visit: Payer: Self-pay

## 2022-07-09 NOTE — Patient Outreach (Signed)
  Care Coordination   07/09/2022 Name: Dylan Reyes MRN: [id number] DOB: 1951-09-05   Care Coordination Outreach Attempts:  An unsuccessful telephone outreach was attempted today to offer the patient information about available care coordination services as a benefit of their health plan.   Follow Up Plan:  Additional outreach attempts will be made to offer the patient care coordination information and services.   Encounter Outcome:  No Answer  Care Coordination Interventions Activated:  No   Care Coordination Interventions:  No, not indicated    [place] [facility] [phone]

## 2022-07-30 DIAGNOSIS — E1165 Type 2 diabetes mellitus with hyperglycemia: Secondary | ICD-10-CM | POA: Diagnosis not present

## 2022-07-30 DIAGNOSIS — E890 Postprocedural hypothyroidism: Secondary | ICD-10-CM | POA: Diagnosis not present

## 2022-07-30 DIAGNOSIS — E7800 Pure hypercholesterolemia, unspecified: Secondary | ICD-10-CM | POA: Diagnosis not present

## 2022-08-06 ENCOUNTER — Telehealth: Payer: Self-pay

## 2022-08-06 DIAGNOSIS — E7800 Pure hypercholesterolemia, unspecified: Secondary | ICD-10-CM | POA: Diagnosis not present

## 2022-08-06 DIAGNOSIS — E1165 Type 2 diabetes mellitus with hyperglycemia: Secondary | ICD-10-CM | POA: Diagnosis not present

## 2022-08-06 DIAGNOSIS — Z9884 Bariatric surgery status: Secondary | ICD-10-CM | POA: Diagnosis not present

## 2022-08-06 DIAGNOSIS — C73 Malignant neoplasm of thyroid gland: Secondary | ICD-10-CM | POA: Diagnosis not present

## 2022-08-06 DIAGNOSIS — E890 Postprocedural hypothyroidism: Secondary | ICD-10-CM | POA: Diagnosis not present

## 2022-08-06 DIAGNOSIS — I10 Essential (primary) hypertension: Secondary | ICD-10-CM | POA: Diagnosis not present

## 2022-08-06 NOTE — Patient Outreach (Signed)
  Care Coordination   Initial Visit Note   08/06/2022 Name: TAIWO FISH MRN: [id number] DOB: July 18, 1951  TADARRIUS BURCH is a 71 y.o. year old male who sees Walters, L.Marlou Sa, MD for primary care. I spoke with  Durene Romans by phone today.  What matters to the patients health and wellness today?  No Concerns Expressed. Requested call back next week.    Goals Addressed   None     SDOH assessments and interventions completed:  No     Care Coordination Interventions Activated:  No  Care Coordination Interventions:  No, not indicated   Follow up plan:  Will follow up next week.    Encounter Outcome:  Pt. Request to Call Back   [place] [facility] [phone]

## 2022-08-19 DIAGNOSIS — M5135 Other intervertebral disc degeneration, thoracolumbar region: Secondary | ICD-10-CM | POA: Diagnosis not present

## 2022-08-19 DIAGNOSIS — M9905 Segmental and somatic dysfunction of pelvic region: Secondary | ICD-10-CM | POA: Diagnosis not present

## 2022-08-19 DIAGNOSIS — M9902 Segmental and somatic dysfunction of thoracic region: Secondary | ICD-10-CM | POA: Diagnosis not present

## 2022-08-19 DIAGNOSIS — Q72891 Other reduction defects of right lower limb: Secondary | ICD-10-CM | POA: Diagnosis not present

## 2022-08-19 DIAGNOSIS — M62830 Muscle spasm of back: Secondary | ICD-10-CM | POA: Diagnosis not present

## 2022-08-19 DIAGNOSIS — M5137 Other intervertebral disc degeneration, lumbosacral region: Secondary | ICD-10-CM | POA: Diagnosis not present

## 2022-08-19 DIAGNOSIS — M9903 Segmental and somatic dysfunction of lumbar region: Secondary | ICD-10-CM | POA: Diagnosis not present

## 2022-08-19 DIAGNOSIS — M9904 Segmental and somatic dysfunction of sacral region: Secondary | ICD-10-CM | POA: Diagnosis not present

## 2022-08-20 DIAGNOSIS — M9904 Segmental and somatic dysfunction of sacral region: Secondary | ICD-10-CM | POA: Diagnosis not present

## 2022-08-20 DIAGNOSIS — M9905 Segmental and somatic dysfunction of pelvic region: Secondary | ICD-10-CM | POA: Diagnosis not present

## 2022-08-20 DIAGNOSIS — M9903 Segmental and somatic dysfunction of lumbar region: Secondary | ICD-10-CM | POA: Diagnosis not present

## 2022-08-20 DIAGNOSIS — M9902 Segmental and somatic dysfunction of thoracic region: Secondary | ICD-10-CM | POA: Diagnosis not present

## 2022-08-20 DIAGNOSIS — M62830 Muscle spasm of back: Secondary | ICD-10-CM | POA: Diagnosis not present

## 2022-08-20 DIAGNOSIS — M5137 Other intervertebral disc degeneration, lumbosacral region: Secondary | ICD-10-CM | POA: Diagnosis not present

## 2022-08-20 DIAGNOSIS — M5135 Other intervertebral disc degeneration, thoracolumbar region: Secondary | ICD-10-CM | POA: Diagnosis not present

## 2022-08-20 DIAGNOSIS — Q72891 Other reduction defects of right lower limb: Secondary | ICD-10-CM | POA: Diagnosis not present

## 2022-08-23 DIAGNOSIS — M9903 Segmental and somatic dysfunction of lumbar region: Secondary | ICD-10-CM | POA: Diagnosis not present

## 2022-08-23 DIAGNOSIS — M62830 Muscle spasm of back: Secondary | ICD-10-CM | POA: Diagnosis not present

## 2022-08-23 DIAGNOSIS — M9905 Segmental and somatic dysfunction of pelvic region: Secondary | ICD-10-CM | POA: Diagnosis not present

## 2022-08-23 DIAGNOSIS — M5135 Other intervertebral disc degeneration, thoracolumbar region: Secondary | ICD-10-CM | POA: Diagnosis not present

## 2022-08-23 DIAGNOSIS — M9902 Segmental and somatic dysfunction of thoracic region: Secondary | ICD-10-CM | POA: Diagnosis not present

## 2022-08-23 DIAGNOSIS — M9904 Segmental and somatic dysfunction of sacral region: Secondary | ICD-10-CM | POA: Diagnosis not present

## 2022-08-23 DIAGNOSIS — Q72891 Other reduction defects of right lower limb: Secondary | ICD-10-CM | POA: Diagnosis not present

## 2022-08-23 DIAGNOSIS — M5137 Other intervertebral disc degeneration, lumbosacral region: Secondary | ICD-10-CM | POA: Diagnosis not present

## 2022-08-24 DIAGNOSIS — Q72891 Other reduction defects of right lower limb: Secondary | ICD-10-CM | POA: Diagnosis not present

## 2022-08-24 DIAGNOSIS — M62830 Muscle spasm of back: Secondary | ICD-10-CM | POA: Diagnosis not present

## 2022-08-24 DIAGNOSIS — M5137 Other intervertebral disc degeneration, lumbosacral region: Secondary | ICD-10-CM | POA: Diagnosis not present

## 2022-08-24 DIAGNOSIS — M9904 Segmental and somatic dysfunction of sacral region: Secondary | ICD-10-CM | POA: Diagnosis not present

## 2022-08-24 DIAGNOSIS — M9905 Segmental and somatic dysfunction of pelvic region: Secondary | ICD-10-CM | POA: Diagnosis not present

## 2022-08-24 DIAGNOSIS — M9902 Segmental and somatic dysfunction of thoracic region: Secondary | ICD-10-CM | POA: Diagnosis not present

## 2022-08-24 DIAGNOSIS — M9903 Segmental and somatic dysfunction of lumbar region: Secondary | ICD-10-CM | POA: Diagnosis not present

## 2022-08-24 DIAGNOSIS — M5135 Other intervertebral disc degeneration, thoracolumbar region: Secondary | ICD-10-CM | POA: Diagnosis not present

## 2022-08-25 DIAGNOSIS — M9904 Segmental and somatic dysfunction of sacral region: Secondary | ICD-10-CM | POA: Diagnosis not present

## 2022-08-25 DIAGNOSIS — M9905 Segmental and somatic dysfunction of pelvic region: Secondary | ICD-10-CM | POA: Diagnosis not present

## 2022-08-25 DIAGNOSIS — Q72891 Other reduction defects of right lower limb: Secondary | ICD-10-CM | POA: Diagnosis not present

## 2022-08-25 DIAGNOSIS — M5135 Other intervertebral disc degeneration, thoracolumbar region: Secondary | ICD-10-CM | POA: Diagnosis not present

## 2022-08-25 DIAGNOSIS — M5137 Other intervertebral disc degeneration, lumbosacral region: Secondary | ICD-10-CM | POA: Diagnosis not present

## 2022-08-25 DIAGNOSIS — M9903 Segmental and somatic dysfunction of lumbar region: Secondary | ICD-10-CM | POA: Diagnosis not present

## 2022-08-25 DIAGNOSIS — M9902 Segmental and somatic dysfunction of thoracic region: Secondary | ICD-10-CM | POA: Diagnosis not present

## 2022-08-25 DIAGNOSIS — M62830 Muscle spasm of back: Secondary | ICD-10-CM | POA: Diagnosis not present

## 2022-08-30 DIAGNOSIS — Q72891 Other reduction defects of right lower limb: Secondary | ICD-10-CM | POA: Diagnosis not present

## 2022-08-30 DIAGNOSIS — M9904 Segmental and somatic dysfunction of sacral region: Secondary | ICD-10-CM | POA: Diagnosis not present

## 2022-08-30 DIAGNOSIS — M9902 Segmental and somatic dysfunction of thoracic region: Secondary | ICD-10-CM | POA: Diagnosis not present

## 2022-08-30 DIAGNOSIS — M5135 Other intervertebral disc degeneration, thoracolumbar region: Secondary | ICD-10-CM | POA: Diagnosis not present

## 2022-08-30 DIAGNOSIS — M62830 Muscle spasm of back: Secondary | ICD-10-CM | POA: Diagnosis not present

## 2022-08-30 DIAGNOSIS — M9905 Segmental and somatic dysfunction of pelvic region: Secondary | ICD-10-CM | POA: Diagnosis not present

## 2022-08-30 DIAGNOSIS — M5137 Other intervertebral disc degeneration, lumbosacral region: Secondary | ICD-10-CM | POA: Diagnosis not present

## 2022-08-30 DIAGNOSIS — M9903 Segmental and somatic dysfunction of lumbar region: Secondary | ICD-10-CM | POA: Diagnosis not present

## 2022-09-02 DIAGNOSIS — M62830 Muscle spasm of back: Secondary | ICD-10-CM | POA: Diagnosis not present

## 2022-09-02 DIAGNOSIS — M5135 Other intervertebral disc degeneration, thoracolumbar region: Secondary | ICD-10-CM | POA: Diagnosis not present

## 2022-09-02 DIAGNOSIS — M9903 Segmental and somatic dysfunction of lumbar region: Secondary | ICD-10-CM | POA: Diagnosis not present

## 2022-09-02 DIAGNOSIS — M5137 Other intervertebral disc degeneration, lumbosacral region: Secondary | ICD-10-CM | POA: Diagnosis not present

## 2022-09-02 DIAGNOSIS — M9902 Segmental and somatic dysfunction of thoracic region: Secondary | ICD-10-CM | POA: Diagnosis not present

## 2022-09-02 DIAGNOSIS — M9904 Segmental and somatic dysfunction of sacral region: Secondary | ICD-10-CM | POA: Diagnosis not present

## 2022-09-02 DIAGNOSIS — M9905 Segmental and somatic dysfunction of pelvic region: Secondary | ICD-10-CM | POA: Diagnosis not present

## 2022-09-02 DIAGNOSIS — Q72891 Other reduction defects of right lower limb: Secondary | ICD-10-CM | POA: Diagnosis not present

## 2022-10-01 DIAGNOSIS — M25511 Pain in right shoulder: Secondary | ICD-10-CM | POA: Diagnosis not present

## 2022-10-12 DIAGNOSIS — M6281 Muscle weakness (generalized): Secondary | ICD-10-CM | POA: Diagnosis not present

## 2022-10-12 DIAGNOSIS — M25611 Stiffness of right shoulder, not elsewhere classified: Secondary | ICD-10-CM | POA: Diagnosis not present

## 2022-10-12 DIAGNOSIS — M7501 Adhesive capsulitis of right shoulder: Secondary | ICD-10-CM | POA: Diagnosis not present

## 2022-10-19 DIAGNOSIS — M7501 Adhesive capsulitis of right shoulder: Secondary | ICD-10-CM | POA: Diagnosis not present

## 2022-10-19 DIAGNOSIS — M25611 Stiffness of right shoulder, not elsewhere classified: Secondary | ICD-10-CM | POA: Diagnosis not present

## 2022-10-19 DIAGNOSIS — M6281 Muscle weakness (generalized): Secondary | ICD-10-CM | POA: Diagnosis not present

## 2022-10-25 DIAGNOSIS — E119 Type 2 diabetes mellitus without complications: Secondary | ICD-10-CM | POA: Diagnosis not present

## 2022-10-25 DIAGNOSIS — L309 Dermatitis, unspecified: Secondary | ICD-10-CM | POA: Diagnosis not present

## 2022-10-25 DIAGNOSIS — E039 Hypothyroidism, unspecified: Secondary | ICD-10-CM | POA: Diagnosis not present

## 2022-10-25 DIAGNOSIS — I10 Essential (primary) hypertension: Secondary | ICD-10-CM | POA: Diagnosis not present

## 2022-10-25 DIAGNOSIS — Z23 Encounter for immunization: Secondary | ICD-10-CM | POA: Diagnosis not present

## 2022-10-25 DIAGNOSIS — G4733 Obstructive sleep apnea (adult) (pediatric): Secondary | ICD-10-CM | POA: Diagnosis not present

## 2022-10-25 DIAGNOSIS — R252 Cramp and spasm: Secondary | ICD-10-CM | POA: Diagnosis not present

## 2022-10-25 DIAGNOSIS — I700 Atherosclerosis of aorta: Secondary | ICD-10-CM | POA: Diagnosis not present

## 2022-10-25 DIAGNOSIS — Z0000 Encounter for general adult medical examination without abnormal findings: Secondary | ICD-10-CM | POA: Diagnosis not present

## 2022-10-25 DIAGNOSIS — E782 Mixed hyperlipidemia: Secondary | ICD-10-CM | POA: Diagnosis not present

## 2022-11-16 DIAGNOSIS — M7501 Adhesive capsulitis of right shoulder: Secondary | ICD-10-CM | POA: Diagnosis not present

## 2022-11-18 DIAGNOSIS — M25511 Pain in right shoulder: Secondary | ICD-10-CM | POA: Diagnosis not present

## 2022-11-23 DIAGNOSIS — M25511 Pain in right shoulder: Secondary | ICD-10-CM | POA: Diagnosis not present

## 2022-12-13 DIAGNOSIS — M7521 Bicipital tendinitis, right shoulder: Secondary | ICD-10-CM | POA: Diagnosis not present

## 2022-12-13 DIAGNOSIS — G8918 Other acute postprocedural pain: Secondary | ICD-10-CM | POA: Diagnosis not present

## 2022-12-13 DIAGNOSIS — M19011 Primary osteoarthritis, right shoulder: Secondary | ICD-10-CM | POA: Diagnosis not present

## 2022-12-13 DIAGNOSIS — M24111 Other articular cartilage disorders, right shoulder: Secondary | ICD-10-CM | POA: Diagnosis not present

## 2022-12-13 DIAGNOSIS — M67813 Other specified disorders of tendon, right shoulder: Secondary | ICD-10-CM | POA: Diagnosis not present

## 2022-12-13 DIAGNOSIS — S46011A Strain of muscle(s) and tendon(s) of the rotator cuff of right shoulder, initial encounter: Secondary | ICD-10-CM | POA: Diagnosis not present

## 2022-12-13 DIAGNOSIS — M7541 Impingement syndrome of right shoulder: Secondary | ICD-10-CM | POA: Diagnosis not present

## 2022-12-13 DIAGNOSIS — M25811 Other specified joint disorders, right shoulder: Secondary | ICD-10-CM | POA: Diagnosis not present

## 2022-12-20 DIAGNOSIS — M6281 Muscle weakness (generalized): Secondary | ICD-10-CM | POA: Diagnosis not present

## 2022-12-20 DIAGNOSIS — M75111 Incomplete rotator cuff tear or rupture of right shoulder, not specified as traumatic: Secondary | ICD-10-CM | POA: Diagnosis not present

## 2022-12-20 DIAGNOSIS — M25611 Stiffness of right shoulder, not elsewhere classified: Secondary | ICD-10-CM | POA: Diagnosis not present

## 2022-12-21 DIAGNOSIS — M19011 Primary osteoarthritis, right shoulder: Secondary | ICD-10-CM | POA: Diagnosis not present

## 2022-12-23 DIAGNOSIS — M25611 Stiffness of right shoulder, not elsewhere classified: Secondary | ICD-10-CM | POA: Diagnosis not present

## 2022-12-23 DIAGNOSIS — M6281 Muscle weakness (generalized): Secondary | ICD-10-CM | POA: Diagnosis not present

## 2022-12-23 DIAGNOSIS — M75111 Incomplete rotator cuff tear or rupture of right shoulder, not specified as traumatic: Secondary | ICD-10-CM | POA: Diagnosis not present

## 2023-01-04 DIAGNOSIS — M25611 Stiffness of right shoulder, not elsewhere classified: Secondary | ICD-10-CM | POA: Diagnosis not present

## 2023-01-04 DIAGNOSIS — M6281 Muscle weakness (generalized): Secondary | ICD-10-CM | POA: Diagnosis not present

## 2023-01-04 DIAGNOSIS — M75111 Incomplete rotator cuff tear or rupture of right shoulder, not specified as traumatic: Secondary | ICD-10-CM | POA: Diagnosis not present

## 2023-01-19 DIAGNOSIS — M75111 Incomplete rotator cuff tear or rupture of right shoulder, not specified as traumatic: Secondary | ICD-10-CM | POA: Diagnosis not present

## 2023-01-19 DIAGNOSIS — M25611 Stiffness of right shoulder, not elsewhere classified: Secondary | ICD-10-CM | POA: Diagnosis not present

## 2023-01-19 DIAGNOSIS — M6281 Muscle weakness (generalized): Secondary | ICD-10-CM | POA: Diagnosis not present

## 2023-01-25 DIAGNOSIS — M75111 Incomplete rotator cuff tear or rupture of right shoulder, not specified as traumatic: Secondary | ICD-10-CM | POA: Diagnosis not present

## 2023-01-27 DIAGNOSIS — E1165 Type 2 diabetes mellitus with hyperglycemia: Secondary | ICD-10-CM | POA: Diagnosis not present

## 2023-01-27 DIAGNOSIS — E7800 Pure hypercholesterolemia, unspecified: Secondary | ICD-10-CM | POA: Diagnosis not present

## 2023-01-27 DIAGNOSIS — E890 Postprocedural hypothyroidism: Secondary | ICD-10-CM | POA: Diagnosis not present

## 2023-02-01 DIAGNOSIS — M6281 Muscle weakness (generalized): Secondary | ICD-10-CM | POA: Diagnosis not present

## 2023-02-01 DIAGNOSIS — M75111 Incomplete rotator cuff tear or rupture of right shoulder, not specified as traumatic: Secondary | ICD-10-CM | POA: Diagnosis not present

## 2023-02-01 DIAGNOSIS — M25611 Stiffness of right shoulder, not elsewhere classified: Secondary | ICD-10-CM | POA: Diagnosis not present

## 2023-02-03 DIAGNOSIS — M75111 Incomplete rotator cuff tear or rupture of right shoulder, not specified as traumatic: Secondary | ICD-10-CM | POA: Diagnosis not present

## 2023-02-03 DIAGNOSIS — E1165 Type 2 diabetes mellitus with hyperglycemia: Secondary | ICD-10-CM | POA: Diagnosis not present

## 2023-02-03 DIAGNOSIS — M6281 Muscle weakness (generalized): Secondary | ICD-10-CM | POA: Diagnosis not present

## 2023-02-03 DIAGNOSIS — Z9884 Bariatric surgery status: Secondary | ICD-10-CM | POA: Diagnosis not present

## 2023-02-03 DIAGNOSIS — E890 Postprocedural hypothyroidism: Secondary | ICD-10-CM | POA: Diagnosis not present

## 2023-02-03 DIAGNOSIS — I10 Essential (primary) hypertension: Secondary | ICD-10-CM | POA: Diagnosis not present

## 2023-02-03 DIAGNOSIS — C73 Malignant neoplasm of thyroid gland: Secondary | ICD-10-CM | POA: Diagnosis not present

## 2023-02-03 DIAGNOSIS — M25611 Stiffness of right shoulder, not elsewhere classified: Secondary | ICD-10-CM | POA: Diagnosis not present

## 2023-02-03 DIAGNOSIS — E7800 Pure hypercholesterolemia, unspecified: Secondary | ICD-10-CM | POA: Diagnosis not present

## 2023-02-16 DIAGNOSIS — M75111 Incomplete rotator cuff tear or rupture of right shoulder, not specified as traumatic: Secondary | ICD-10-CM | POA: Diagnosis not present

## 2023-02-16 DIAGNOSIS — M6281 Muscle weakness (generalized): Secondary | ICD-10-CM | POA: Diagnosis not present

## 2023-02-16 DIAGNOSIS — M25611 Stiffness of right shoulder, not elsewhere classified: Secondary | ICD-10-CM | POA: Diagnosis not present

## 2023-02-18 DIAGNOSIS — M75111 Incomplete rotator cuff tear or rupture of right shoulder, not specified as traumatic: Secondary | ICD-10-CM | POA: Diagnosis not present

## 2023-02-18 DIAGNOSIS — M6281 Muscle weakness (generalized): Secondary | ICD-10-CM | POA: Diagnosis not present

## 2023-02-18 DIAGNOSIS — M25611 Stiffness of right shoulder, not elsewhere classified: Secondary | ICD-10-CM | POA: Diagnosis not present

## 2023-02-22 DIAGNOSIS — M75111 Incomplete rotator cuff tear or rupture of right shoulder, not specified as traumatic: Secondary | ICD-10-CM | POA: Diagnosis not present

## 2023-02-22 DIAGNOSIS — M25611 Stiffness of right shoulder, not elsewhere classified: Secondary | ICD-10-CM | POA: Diagnosis not present

## 2023-02-22 DIAGNOSIS — M6281 Muscle weakness (generalized): Secondary | ICD-10-CM | POA: Diagnosis not present

## 2023-02-24 DIAGNOSIS — M6281 Muscle weakness (generalized): Secondary | ICD-10-CM | POA: Diagnosis not present

## 2023-02-24 DIAGNOSIS — M25611 Stiffness of right shoulder, not elsewhere classified: Secondary | ICD-10-CM | POA: Diagnosis not present

## 2023-02-24 DIAGNOSIS — M75111 Incomplete rotator cuff tear or rupture of right shoulder, not specified as traumatic: Secondary | ICD-10-CM | POA: Diagnosis not present

## 2023-03-01 DIAGNOSIS — M6281 Muscle weakness (generalized): Secondary | ICD-10-CM | POA: Diagnosis not present

## 2023-03-01 DIAGNOSIS — M75111 Incomplete rotator cuff tear or rupture of right shoulder, not specified as traumatic: Secondary | ICD-10-CM | POA: Diagnosis not present

## 2023-03-01 DIAGNOSIS — M25611 Stiffness of right shoulder, not elsewhere classified: Secondary | ICD-10-CM | POA: Diagnosis not present

## 2023-03-03 DIAGNOSIS — M75111 Incomplete rotator cuff tear or rupture of right shoulder, not specified as traumatic: Secondary | ICD-10-CM | POA: Diagnosis not present

## 2023-03-03 DIAGNOSIS — M25611 Stiffness of right shoulder, not elsewhere classified: Secondary | ICD-10-CM | POA: Diagnosis not present

## 2023-03-03 DIAGNOSIS — M6281 Muscle weakness (generalized): Secondary | ICD-10-CM | POA: Diagnosis not present

## 2023-03-08 DIAGNOSIS — M6281 Muscle weakness (generalized): Secondary | ICD-10-CM | POA: Diagnosis not present

## 2023-03-08 DIAGNOSIS — M25611 Stiffness of right shoulder, not elsewhere classified: Secondary | ICD-10-CM | POA: Diagnosis not present

## 2023-03-08 DIAGNOSIS — M75111 Incomplete rotator cuff tear or rupture of right shoulder, not specified as traumatic: Secondary | ICD-10-CM | POA: Diagnosis not present

## 2023-03-30 DIAGNOSIS — I700 Atherosclerosis of aorta: Secondary | ICD-10-CM | POA: Diagnosis not present

## 2023-03-30 DIAGNOSIS — E039 Hypothyroidism, unspecified: Secondary | ICD-10-CM | POA: Diagnosis not present

## 2023-03-30 DIAGNOSIS — R252 Cramp and spasm: Secondary | ICD-10-CM | POA: Diagnosis not present

## 2023-03-30 DIAGNOSIS — E1165 Type 2 diabetes mellitus with hyperglycemia: Secondary | ICD-10-CM | POA: Diagnosis not present

## 2023-03-30 DIAGNOSIS — M542 Cervicalgia: Secondary | ICD-10-CM | POA: Diagnosis not present

## 2023-04-05 DIAGNOSIS — M75111 Incomplete rotator cuff tear or rupture of right shoulder, not specified as traumatic: Secondary | ICD-10-CM | POA: Diagnosis not present

## 2023-05-17 DIAGNOSIS — E119 Type 2 diabetes mellitus without complications: Secondary | ICD-10-CM | POA: Diagnosis not present

## 2023-05-18 DIAGNOSIS — Z85850 Personal history of malignant neoplasm of thyroid: Secondary | ICD-10-CM | POA: Diagnosis not present

## 2023-05-18 DIAGNOSIS — I10 Essential (primary) hypertension: Secondary | ICD-10-CM | POA: Diagnosis not present

## 2023-05-18 DIAGNOSIS — R252 Cramp and spasm: Secondary | ICD-10-CM | POA: Diagnosis not present

## 2023-05-18 DIAGNOSIS — E1165 Type 2 diabetes mellitus with hyperglycemia: Secondary | ICD-10-CM | POA: Diagnosis not present

## 2023-05-18 DIAGNOSIS — G4733 Obstructive sleep apnea (adult) (pediatric): Secondary | ICD-10-CM | POA: Diagnosis not present

## 2023-05-18 DIAGNOSIS — E538 Deficiency of other specified B group vitamins: Secondary | ICD-10-CM | POA: Diagnosis not present

## 2023-05-18 DIAGNOSIS — E039 Hypothyroidism, unspecified: Secondary | ICD-10-CM | POA: Diagnosis not present

## 2023-05-18 DIAGNOSIS — E782 Mixed hyperlipidemia: Secondary | ICD-10-CM | POA: Diagnosis not present

## 2023-05-18 DIAGNOSIS — Z9989 Dependence on other enabling machines and devices: Secondary | ICD-10-CM | POA: Diagnosis not present

## 2023-08-09 DIAGNOSIS — R234 Changes in skin texture: Secondary | ICD-10-CM | POA: Diagnosis not present

## 2023-08-09 DIAGNOSIS — B354 Tinea corporis: Secondary | ICD-10-CM | POA: Diagnosis not present

## 2023-08-23 DIAGNOSIS — L308 Other specified dermatitis: Secondary | ICD-10-CM | POA: Diagnosis not present

## 2023-08-23 DIAGNOSIS — B354 Tinea corporis: Secondary | ICD-10-CM | POA: Diagnosis not present

## 2023-08-23 DIAGNOSIS — L309 Dermatitis, unspecified: Secondary | ICD-10-CM | POA: Diagnosis not present

## 2023-08-24 IMAGING — CT CT CTA ABD/PEL W/CM AND/OR W/O CM
2 of 8 series · 11 of 46 positions shown, 17 images · IV contrast (Omnipaque)
Comparison: 06/01/2020

CLINICAL DATA: Acute generalized abdominal pain, severe cramping,
vomiting

EXAM:
CT ANGIOGRAPHY ABDOMEN AND PELVIS WITH CONTRAST AND WITHOUT CONTRAST
TECHNIQUE: Multidetector CT imaging of the abdomen and pelvis was performed
using the standard protocol during bolus administration of
intravenous contrast. Multiplanar reconstructed images and MIPs were
obtained and reviewed to evaluate the vascular anatomy.
CONTRAST:  100mL OMNIPAQUE IOHEXOL 350 MG/ML SOLN

[Series 5: coronals · coronal · 1.01mm/px · 2 of 110 slices shown]
[im 37/110  soft-tissue]
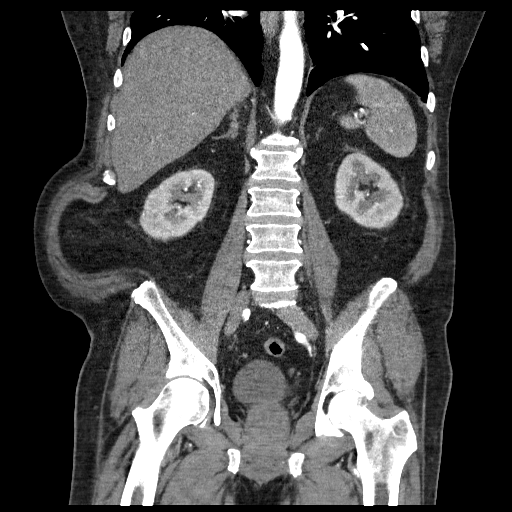
[im 73/110  soft-tissue]
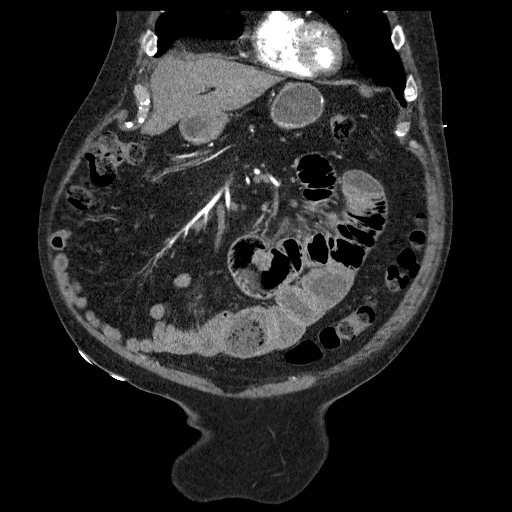

[Series 10: axial venous · axial · portal-venous · 0.93mm/px · z∈[+598,+1002]mm · 9 of 99 slices shown, 15 images]
[im 9/99  soft-tissue]
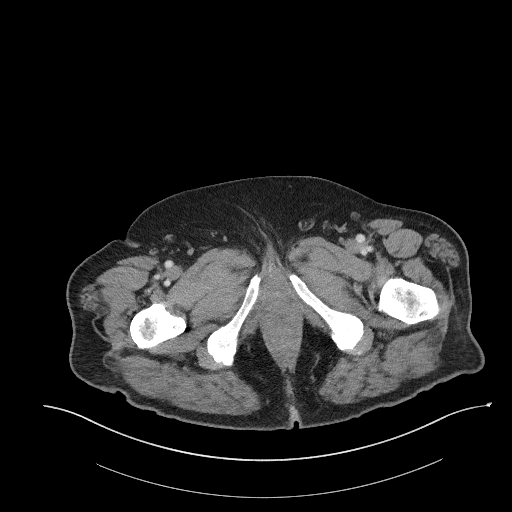
[im 9/99  bone]
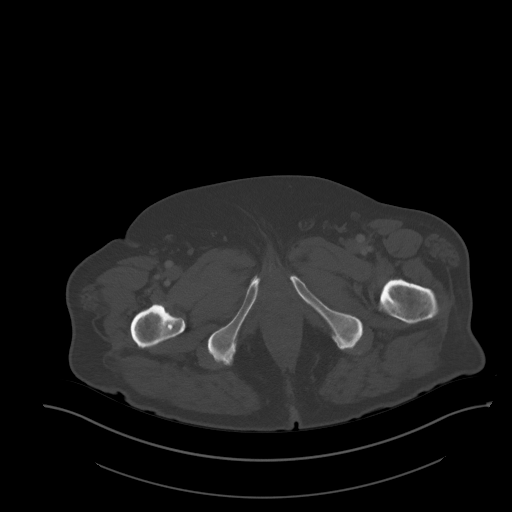
[im 18/99  soft-tissue]
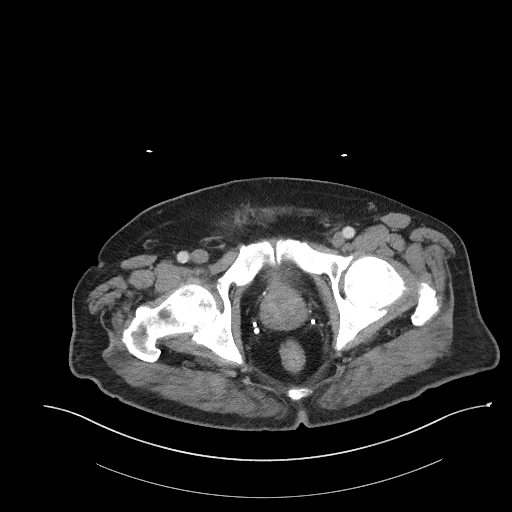
[im 27/99  soft-tissue]
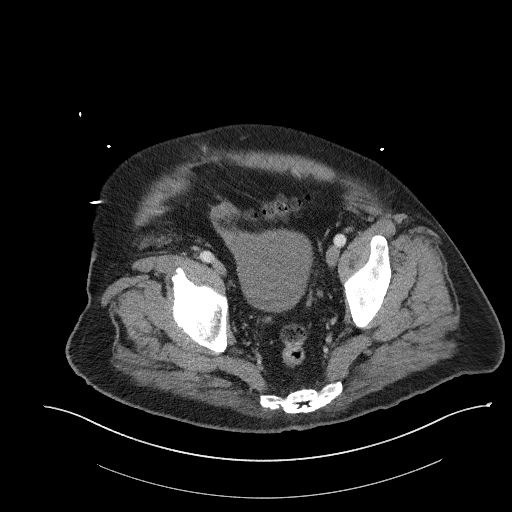
[im 36/99  soft-tissue]
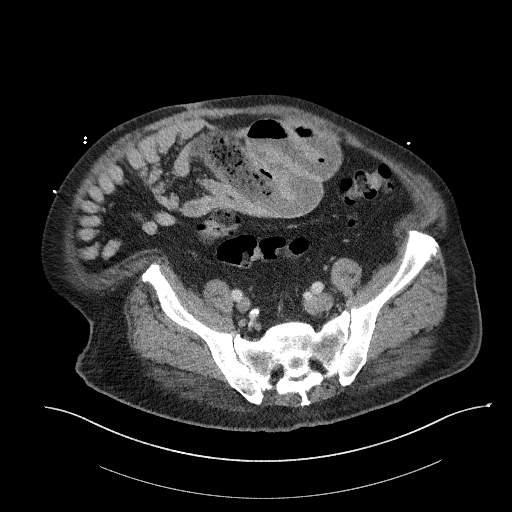
[im 54/99  soft-tissue]
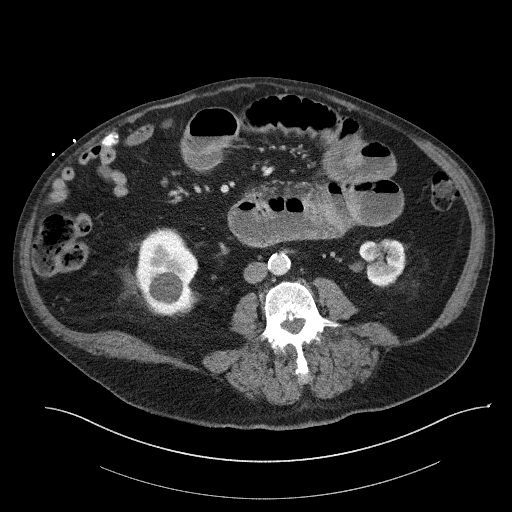
[im 63/99  soft-tissue]
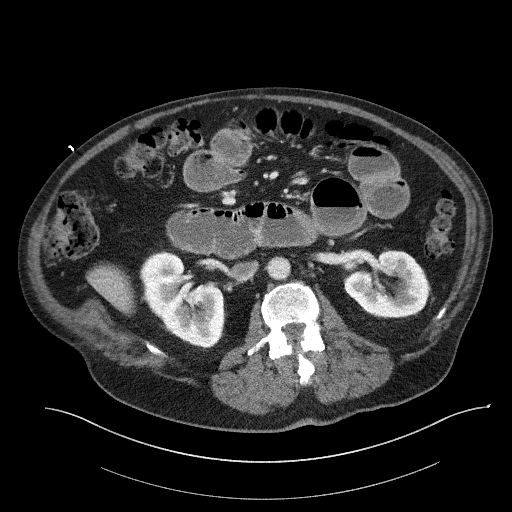
[im 63/99  lung]
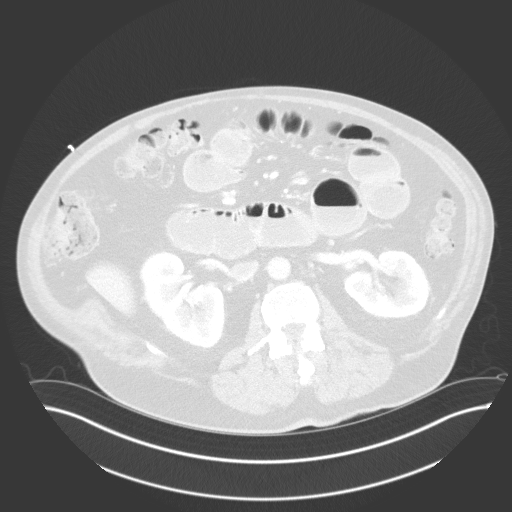
[im 72/99  soft-tissue]
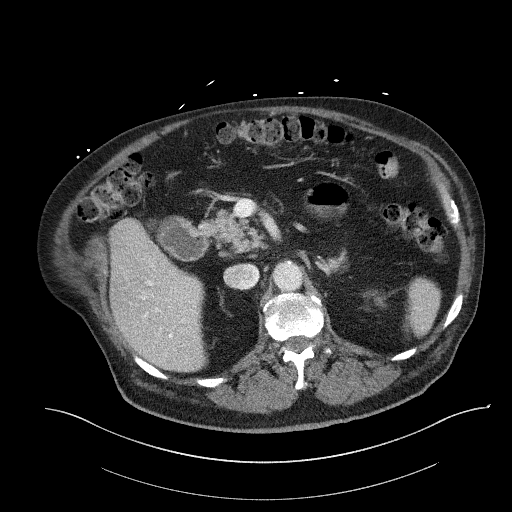
[im 72/99  lung]
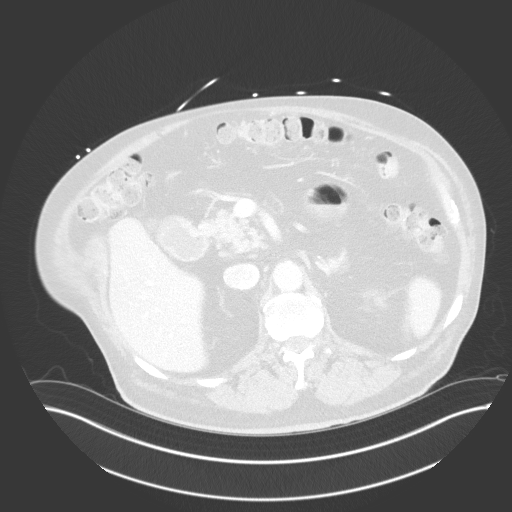
[im 81/99  soft-tissue]
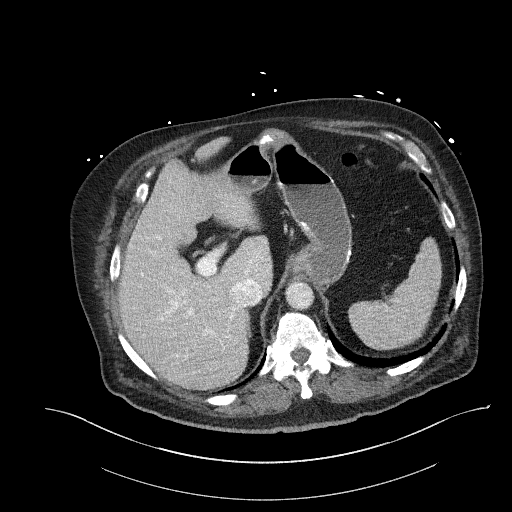
[im 81/99  lung]
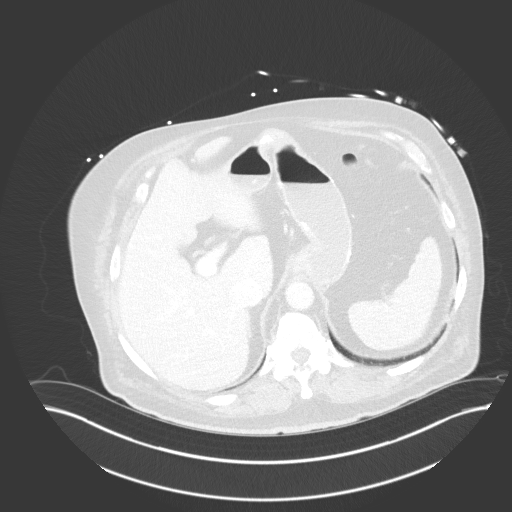
[im 90/99  soft-tissue]
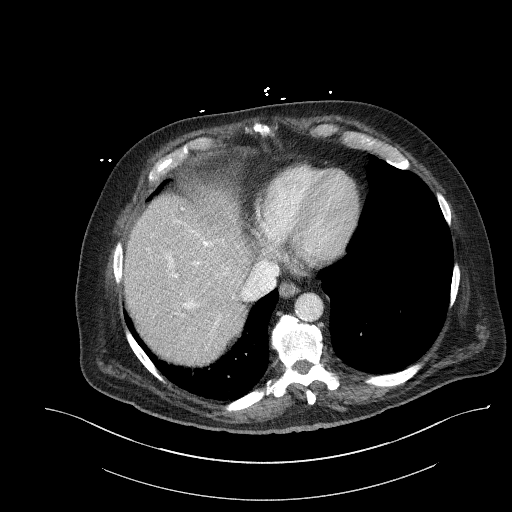
[im 90/99  lung]
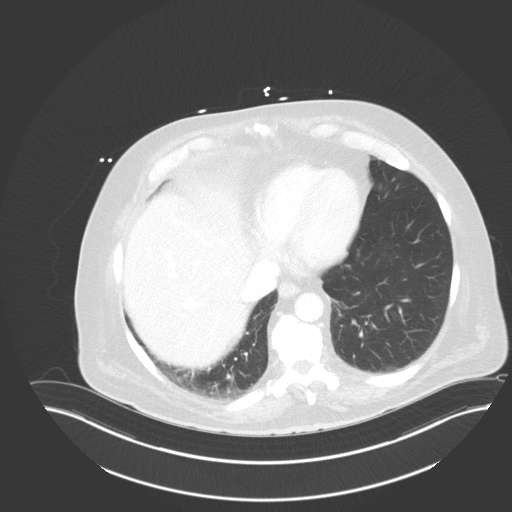
[im 90/99  bone]
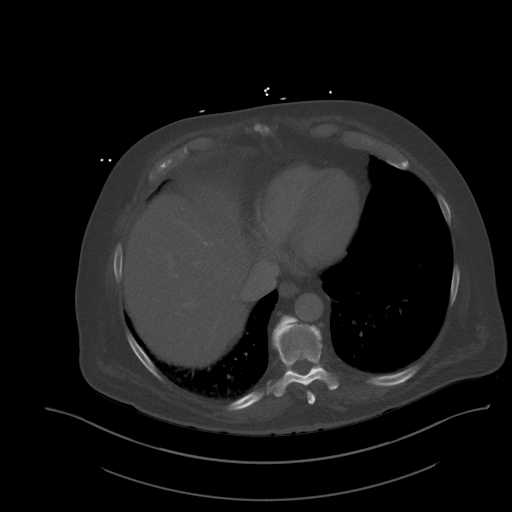

[11 of 46 positions shown; findings below may reference images not displayed]

FINDINGS: VASCULAR

Aorta: Infrarenal aorta and iliac bifurcation atherosclerosis noted
without aneurysm, dissection, occlusive process, or acute aortic
abnormality. No retroperitoneal hemorrhage, hematoma, evidence of
rupture, or surrounding inflammatory process.

Celiac: Minor atherosclerotic change. Widely patent origin including
its branches.

SMA: Origin atherosclerotic change without significant stenosis or
occlusion. SMA remains patent including its branches in the central
mesentery.

Renals: Widely patent main renal arteries. No renal artery stenosis.
No accessory renal artery appreciated.

IMA: Remains patent off the distal aorta including its branches.

Inflow: Common, internal and external iliac arteries all remain
patent. No inflow disease or iliac occlusion. Minor atherosclerotic
change.

Proximal Outflow: Common femoral, proximal profunda femoral,
proximal superficial femoral arteries all remain patent.

Veins: No [place] process.

Review of the MIP images confirms the above findings.

NON-VASCULAR

Lower chest: Minor basilar atelectasis. Normal heart size. No
pericardial pleural effusion. Thoracic aortic atherosclerosis.

Hepatobiliary: Focal fatty infiltration of the liver along the
falciform ligament anteriorly. No biliary dilatation or other focal
hepatic abnormality. Gallbladder nondistended. Common bile duct
nondilated.

Pancreas: Unremarkable. No pancreatic ductal dilatation or
surrounding inflammatory changes.

Spleen: Normal in size without focal abnormality.

Adrenals/Urinary Tract: Normal adrenal glands. Right kidney lower
pole 2 cm renal cyst noted. Left kidney lower pole cortical atrophy
and punctate 3 mm nonobstructing calculus noted. No renal
obstruction pattern or hydronephrosis. Ureters are symmetric and
decompressed. Urinary bladder unremarkable.

Stomach/Bowel: Previous gastric sleeve surgery. Dilated duodenum as
well as several loops of proximal and mid jejunum in the central
abdomen. Dilated loops have air-fluid levels and angulation.
Fecalization of the small bowel noted where there is a transition
point to collapsed small bowel, image 69/10. Findings compatible
with small-bowel obstruction without clear etiology but favored to
be related to adhesions. No definitive bowel wall thickening. No
pneumatosis or free air. No portal venous air.

No free fluid, fluid collection, hemorrhage, hematoma, or abscess.

Lymphatic: No bulky adenopathy.

Reproductive: No significant finding by CT.

Other: No ascites. Postop changes of the abdominal wall with
muscular atrophy and diastasis. No herniation.

Musculoskeletal: Degenerative changes of the spine. Multilevel
lumbar facet arthropathy. No acute osseous finding.
IMPRESSION: VASCULAR

Aortoiliac atherosclerosis as well abdominal atherosclerosis but
negative for mesenteric occlusive disease. Mesenteric and renal
vasculature all remain patent.

No acute intra-abdominal or pelvic vascular process.

NON-VASCULAR

Postop changes of previous gastric sleeve surgery with dilated
proximal small bowel including the duodenum and jejunum with dilated
loops showing angulation and fecalization. Findings compatible with
small-bowel obstruction suspect related to adhesions.

No evidence of abscess or perforation.

## 2023-08-25 IMAGING — DX DG ABD PORTABLE 1V
2 series · 2 of 2 positions shown · non-contrast
Comparison: Film from the previous day.

CLINICAL DATA: Follow up small bowel obstruction

EXAM:
PORTABLE ABDOMEN - 1 VIEW

[abdomen kub (1 of 2)]
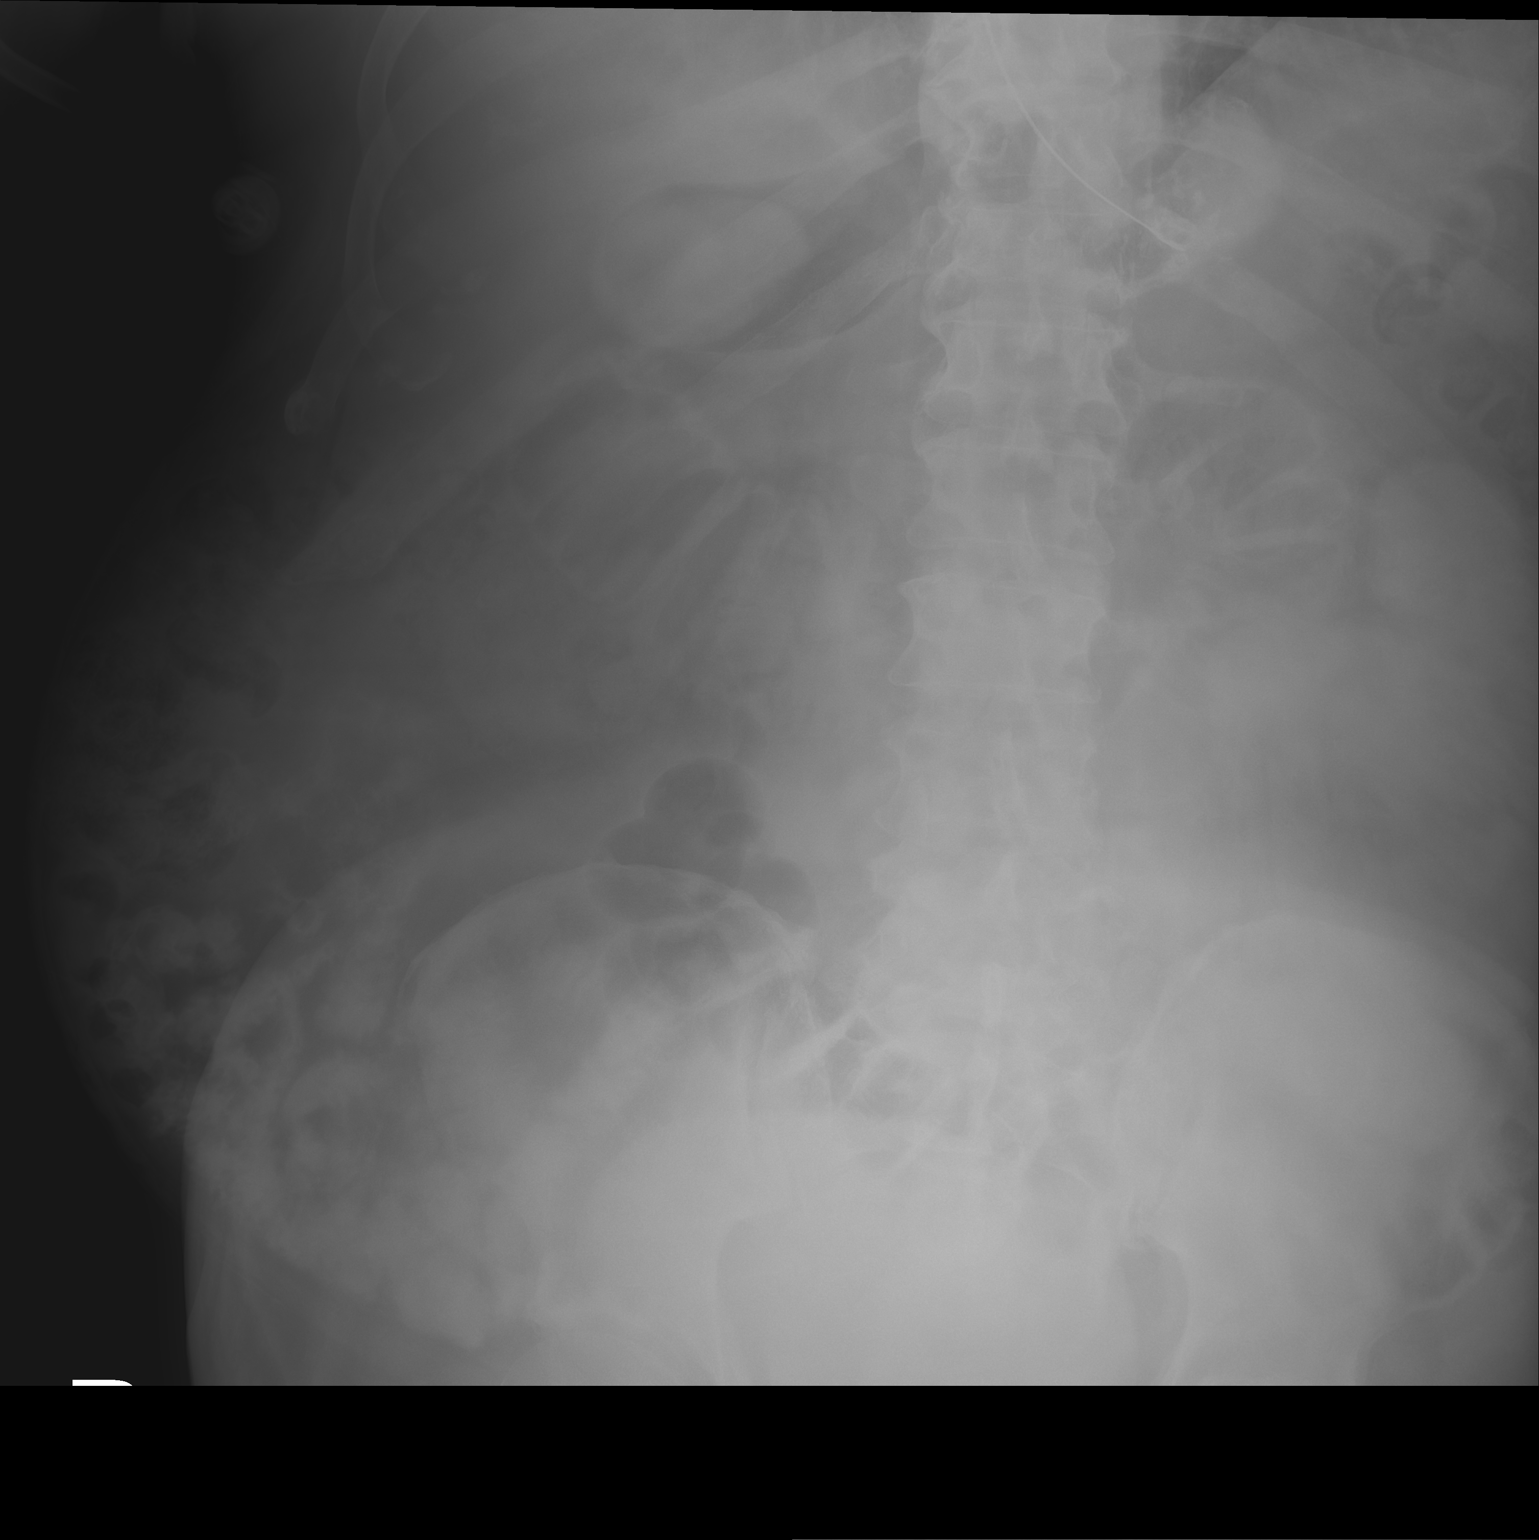

[abdomen kub (2 of 2)]
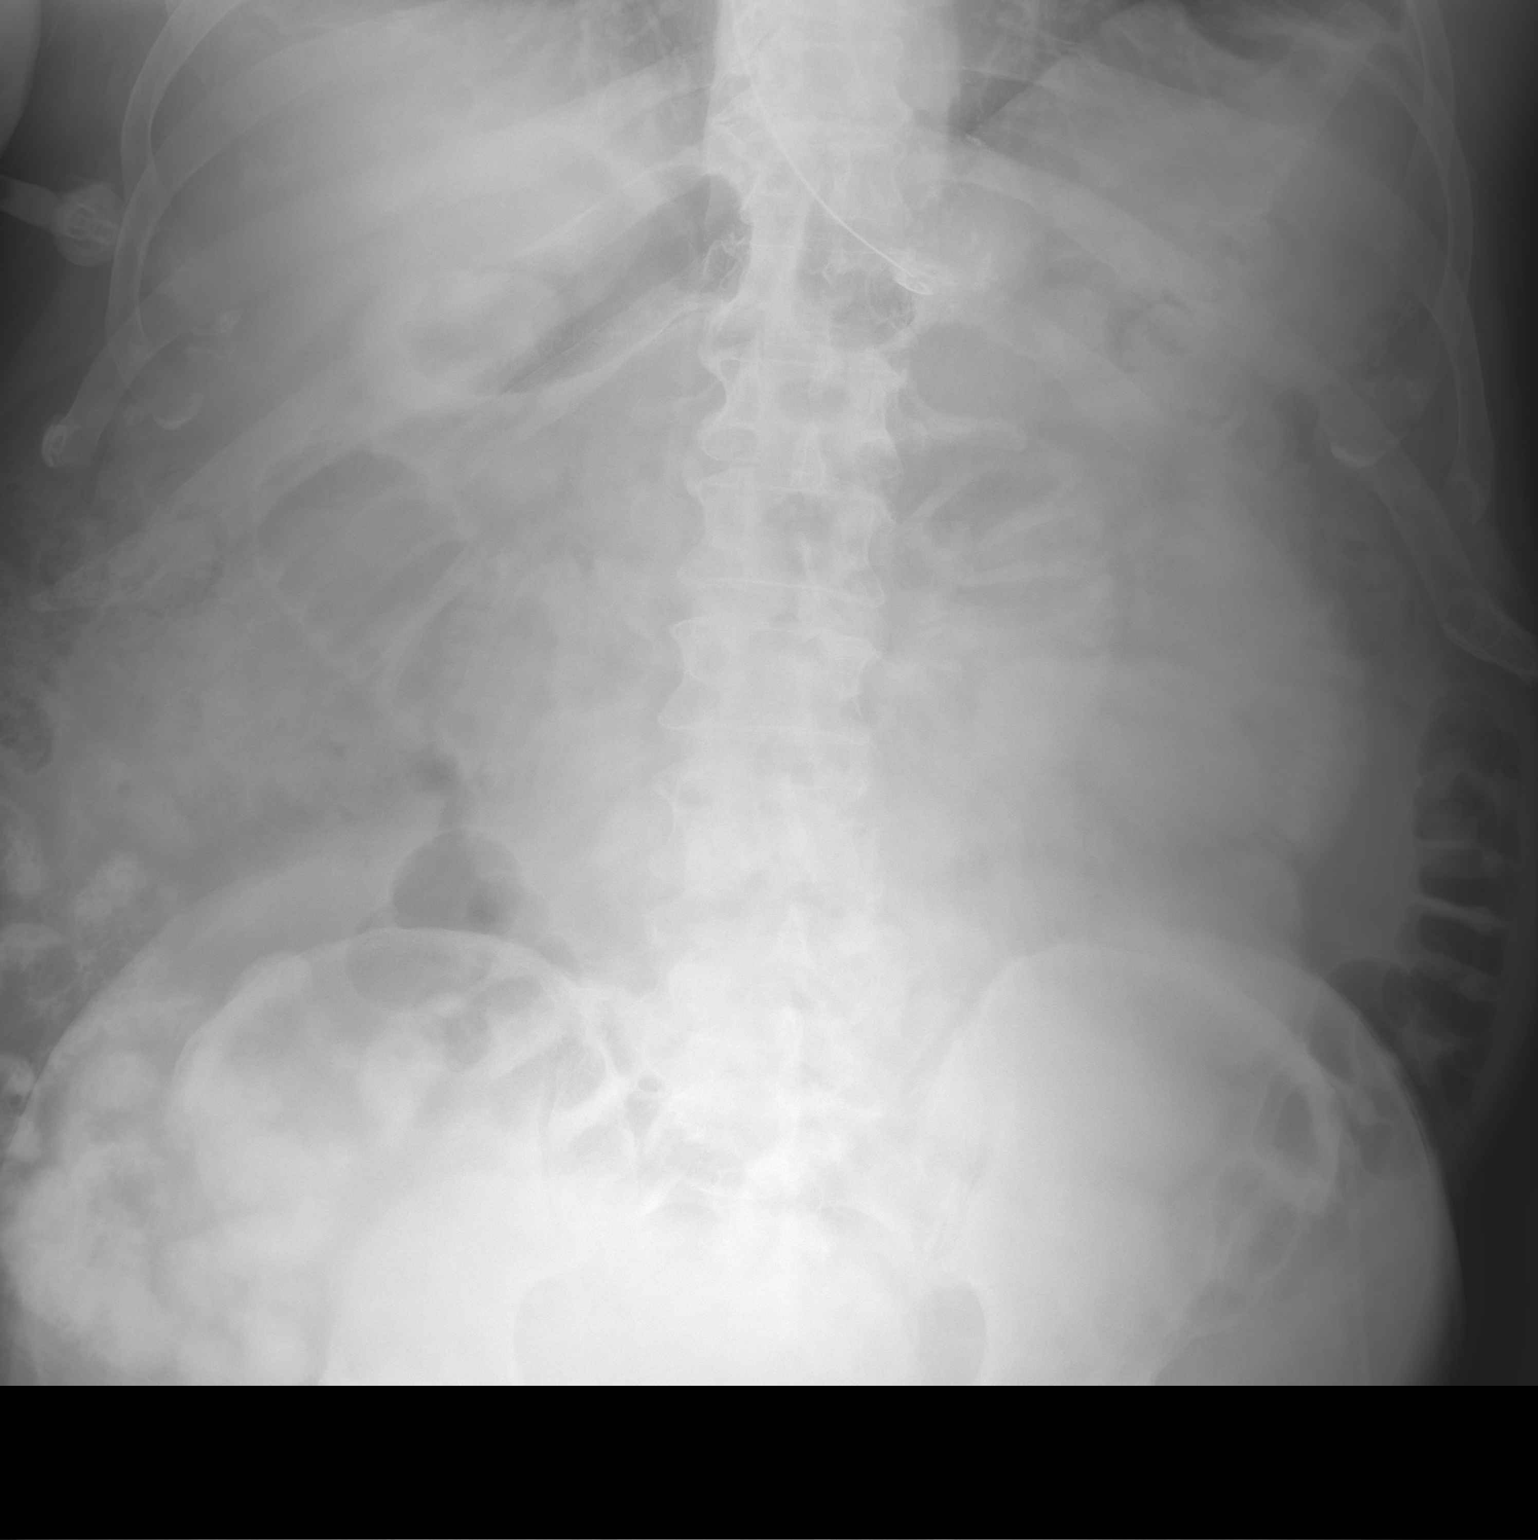

[2 of 2 positions shown; findings below may reference images not displayed]

FINDINGS: Gastric catheter is noted within the stomach. Administered contrast
now lies in the distal small bowel with normal caliber as well as
the proximal colon consistent with a partial small bowel
obstruction. Mildly dilated loops of small bowel are noted similar
to that seen on prior CT.
IMPRESSION: Administered contrast now lies predominately within the distal small
bowel and proximal colon consistent with a partial small bowel
obstruction. Persistent small bowel dilatation is noted.

## 2023-09-29 DIAGNOSIS — E890 Postprocedural hypothyroidism: Secondary | ICD-10-CM | POA: Diagnosis not present

## 2023-09-29 DIAGNOSIS — E7800 Pure hypercholesterolemia, unspecified: Secondary | ICD-10-CM | POA: Diagnosis not present

## 2023-09-29 DIAGNOSIS — E1165 Type 2 diabetes mellitus with hyperglycemia: Secondary | ICD-10-CM | POA: Diagnosis not present

## 2023-10-06 DIAGNOSIS — E7800 Pure hypercholesterolemia, unspecified: Secondary | ICD-10-CM | POA: Diagnosis not present

## 2023-10-06 DIAGNOSIS — Z0100 Encounter for examination of eyes and vision without abnormal findings: Secondary | ICD-10-CM | POA: Diagnosis not present

## 2023-10-06 DIAGNOSIS — Z23 Encounter for immunization: Secondary | ICD-10-CM | POA: Diagnosis not present

## 2023-10-06 DIAGNOSIS — I10 Essential (primary) hypertension: Secondary | ICD-10-CM | POA: Diagnosis not present

## 2023-10-06 DIAGNOSIS — Z9884 Bariatric surgery status: Secondary | ICD-10-CM | POA: Diagnosis not present

## 2023-10-06 DIAGNOSIS — C73 Malignant neoplasm of thyroid gland: Secondary | ICD-10-CM | POA: Diagnosis not present

## 2023-10-06 DIAGNOSIS — E890 Postprocedural hypothyroidism: Secondary | ICD-10-CM | POA: Diagnosis not present

## 2023-10-06 DIAGNOSIS — E1165 Type 2 diabetes mellitus with hyperglycemia: Secondary | ICD-10-CM | POA: Diagnosis not present

## 2024-01-05 DIAGNOSIS — E1165 Type 2 diabetes mellitus with hyperglycemia: Secondary | ICD-10-CM | POA: Diagnosis not present

## 2024-01-05 DIAGNOSIS — E890 Postprocedural hypothyroidism: Secondary | ICD-10-CM | POA: Diagnosis not present

## 2024-01-12 DIAGNOSIS — E1165 Type 2 diabetes mellitus with hyperglycemia: Secondary | ICD-10-CM | POA: Diagnosis not present

## 2024-01-12 DIAGNOSIS — L309 Dermatitis, unspecified: Secondary | ICD-10-CM | POA: Diagnosis not present

## 2024-01-12 DIAGNOSIS — R252 Cramp and spasm: Secondary | ICD-10-CM | POA: Diagnosis not present

## 2024-01-12 DIAGNOSIS — Z9884 Bariatric surgery status: Secondary | ICD-10-CM | POA: Diagnosis not present

## 2024-01-12 DIAGNOSIS — E890 Postprocedural hypothyroidism: Secondary | ICD-10-CM | POA: Diagnosis not present

## 2024-01-12 DIAGNOSIS — E039 Hypothyroidism, unspecified: Secondary | ICD-10-CM | POA: Diagnosis not present

## 2024-01-12 DIAGNOSIS — E538 Deficiency of other specified B group vitamins: Secondary | ICD-10-CM | POA: Diagnosis not present

## 2024-01-12 DIAGNOSIS — Z0000 Encounter for general adult medical examination without abnormal findings: Secondary | ICD-10-CM | POA: Diagnosis not present

## 2024-01-12 DIAGNOSIS — E782 Mixed hyperlipidemia: Secondary | ICD-10-CM | POA: Diagnosis not present

## 2024-01-12 DIAGNOSIS — G4733 Obstructive sleep apnea (adult) (pediatric): Secondary | ICD-10-CM | POA: Diagnosis not present

## 2024-01-12 DIAGNOSIS — C73 Malignant neoplasm of thyroid gland: Secondary | ICD-10-CM | POA: Diagnosis not present

## 2024-01-12 DIAGNOSIS — E7800 Pure hypercholesterolemia, unspecified: Secondary | ICD-10-CM | POA: Diagnosis not present

## 2024-01-12 DIAGNOSIS — I10 Essential (primary) hypertension: Secondary | ICD-10-CM | POA: Diagnosis not present

## 2024-01-12 DIAGNOSIS — E119 Type 2 diabetes mellitus without complications: Secondary | ICD-10-CM | POA: Diagnosis not present

## 2024-01-23 ENCOUNTER — Encounter (INDEPENDENT_AMBULATORY_CARE_PROVIDER_SITE_OTHER): Payer: Self-pay | Admitting: Otolaryngology

## 2024-01-23 ENCOUNTER — Ambulatory Visit (INDEPENDENT_AMBULATORY_CARE_PROVIDER_SITE_OTHER): Payer: Medicare Other | Admitting: Otolaryngology

## 2024-01-23 VITALS — BP 170/83 | HR 84 | Ht 70.0 in | Wt 230.0 lb

## 2024-01-23 DIAGNOSIS — Z91198 Patient's noncompliance with other medical treatment and regimen for other reason: Secondary | ICD-10-CM | POA: Diagnosis not present

## 2024-01-23 DIAGNOSIS — G4733 Obstructive sleep apnea (adult) (pediatric): Secondary | ICD-10-CM | POA: Diagnosis not present

## 2024-01-23 DIAGNOSIS — Z789 Other specified health status: Secondary | ICD-10-CM

## 2024-01-23 NOTE — Patient Instructions (Signed)
  It was very nice to meet you today,   Please see the following link for the [facility] program   [email or web address]  This website has information about how you can connect to other patients who have had Inspire Implant procedure

## 2024-01-23 NOTE — Progress Notes (Unsigned)
 ENT CONSULT:  Reason for Consult: OSA Inspire consult   Ref: Irven Coe, MD   HPI: Discussed the use of AI scribe software for clinical note transcription with the patient, who gave verbal consent to proceed.  History of Present Illness   The patient is a 56 yoM with long-standing hx of obstructive sleep apnea, presents for an Inspire therapy consultation. He was referred by his primary care doctor for an [facility] therapy consultation.  He has a long-standing history of obstructive sleep apnea, diagnosed over 27 years ago. He has been using a CPAP machine consistently every night since his diagnosis. Despite trying different types of masks, he has always returned to the full face mask due to being a mouth breather, although he now uses a patch to keep his mouth closed. He averages about six hours of sleep per night with the CPAP.  He is seeking an alternative to CPAP therapy due to the inconvenience and discomfort of wearing a mask and headgear. He has lost over 100 pounds since his last sleep study, which was conducted approximately ten years ago at [facility]. He does not recall the severity of his sleep apnea from that study. No insomnia, stroke, or heart attack history.  He has a history of thyroid cancer, for which he underwent surgery and radioactive iodine treatment. The cancer recurred, and he was treated at [facility]. He has been in remission since 2005 with no signs of recurrence.         Records Reviewed:  ***    Past Medical History:  Diagnosis Date   Bowel obstruction (HCC)    Diabetes (HCC)    Dr. Talmage Nap   Hypercholesteremia    Hypertension    Thyroid cancer [facility])    Thyroid disease    thyroid cancer 09/02 with radioactive iodine 7/06     Past Surgical History:  Procedure Laterality Date   ABDOMINAL SURGERY     COLON SURGERY     HERNIA REPAIR     LAPAROSCOPIC GASTRIC SLEEVE RESECTION     THYROIDECTOMY      History reviewed. No pertinent family history.  Social  History:  reports that he has never smoked. He has never used smokeless tobacco. He reports current alcohol use. He reports that he does not use drugs.  Allergies:  Allergies  Allergen Reactions   Ciprofloxacin Rash    Medications: I have reviewed the patient's current medications.  The PMH, PSH, Medications, Allergies, and SH were reviewed and updated.  ROS: Constitutional: Negative for fever, weight loss and weight gain. Cardiovascular: Negative for chest pain and dyspnea on exertion. Respiratory: Is not experiencing shortness of breath at rest. Gastrointestinal: Negative for nausea and vomiting. Neurological: Negative for headaches. Psychiatric: The patient is not nervous/anxious  Blood pressure (!) 170/83, pulse 84, height 5\' 10"  (1.778 m), weight 230 lb (104.3 kg), SpO2 99%. Body mass index is 33 kg/m.  PHYSICAL EXAM:  Exam: General: Well-developed, well-nourished Respiratory Respiratory effort: Equal inspiration and expiration without stridor Cardiovascular Peripheral Vascular: Warm extremities with equal color/perfusion Eyes: No nystagmus with equal extraocular motion bilaterally Neuro/Psych/Balance: Patient oriented to person, place, and time; Appropriate mood and affect; Gait is intact with no imbalance; Cranial nerves I-XII are intact Head and Face Inspection: Normocephalic and atraumatic without mass or lesion Palpation: Facial skeleton intact without bony stepoffs Salivary Glands: No mass or tenderness Facial Strength: Facial motility symmetric and full bilaterally ENT Pinna: External ear intact and fully developed External canal: Canal is patent with  intact skin Tympanic Membrane: Clear and mobile External Nose: No scar or anatomic deformity Internal Nose: Septum is ***. No polyp, or purulence. Mucosal edema and erythema present.  Bilateral inferior turbinate hypertrophy.  Lips, Teeth, and gums: Mucosa and teeth intact and viable TMJ: No pain to palpation  with full mobility Oral cavity/oropharynx: No erythema or exudate, no lesions present Nasopharynx: No mass or lesion with intact mucosa Hypopharynx: Intact mucosa without pooling of secretions Larynx Glottic: Full true vocal cord mobility without lesion or mass Supraglottic: Normal appearing epiglottis and AE folds Interarytenoid Space: Moderate pachydermia&edema Subglottic Space: Patent without lesion or edema Neck Neck and Trachea: Midline trachea without mass or lesion Thyroid: No mass or nodularity Lymphatics: No lymphadenopathy  Procedure: Preoperative diagnosis: OSA and CPAP intolerance   Postoperative diagnosis:   Same  Procedure: Flexible fiberoptic laryngoscopy  Surgeon: Ashok Croon, MD  Anesthesia: Topical lidocaine and Afrin Complications: None Condition is stable throughout exam  Indications and consent:  The patient presents to the clinic with above symptoms. Indirect laryngoscopy view was incomplete. Thus it was recommended that they undergo a flexible fiberoptic laryngoscopy. All of the risks, benefits, and potential complications were reviewed with the patient preoperatively and verbal informed consent was obtained.  Procedure: The patient was seated upright in the clinic. Topical lidocaine and Afrin were applied to the nasal cavity. After adequate anesthesia had occurred, I then proceeded to pass the flexible telescope into the nasal cavity. The nasal cavity was patent without rhinorrhea or polyp. The nasopharynx was also patent without mass or lesion. The base of tongue was visualized and was normal. There were no signs of pooling of secretions in the piriform sinuses. The true vocal folds were mobile bilaterally. There were no signs of glottic or supraglottic mucosal lesion or mass. There was *** interarytenoid pachydermia and post cricoid edema. The telescope was then slowly withdrawn and the patient tolerated the procedure throughout.      Studies  Reviewed:***  Assessment/Plan: Encounter Diagnoses  Name Primary?   OSA (obstructive sleep apnea) Yes   Intolerance of continuous positive airway pressure (CPAP) ventilation     Assessment and Plan    Obstructive Sleep Apnea (OSA) Long-standing OSA, diagnosed 27 years ago. Consistent CPAP use but seeking alternatives due to discomfort. Last sleep study 10 years ago; over 100-pound weight loss since. Interested in [place] therapy; requires updated sleep study to assess severity and eligibility. Discussed Inspire therapy, including surgical procedure, eligibility criteria (AHI, BMI, central sleep apneas), and drug-induced sleep endoscopy. Explained procedure involves two incisions, is MRI compatible, and is outpatient with same-day discharge. Criteria: AHI >15, BMI 32-35, <25% central sleep apneas. Recovery: avoid strenuous activities for four weeks, device activation after four weeks, follow-up for titration and repeat sleep tests. - Refer to [facility] for updated sleep study - Provide Inspire therapy brochure and information about the [employer] program - Schedule follow-up appointment to review sleep study results and discuss further steps  Thyroid Cancer (Remission) Thyroid cancer treated with surgery and radioactive iodine. In remission since 2005 with no recurrence. - No immediate action required  General Health Maintenance No specific issues discussed beyond sleep apnea management. - Encourage regular follow-ups with primary care physician for overall health management  Follow-up - Schedule follow-up appointment after sleep study results are available.          Thank you for allowing me to participate in the care of this patient. Please do not hesitate to contact me with any questions or  concerns.   Ashok Croon, MD Otolaryngology [facility] Phone: [phone] Fax: [phone]    01/23/2024, 6:58 PM

## 2024-01-24 ENCOUNTER — Encounter (INDEPENDENT_AMBULATORY_CARE_PROVIDER_SITE_OTHER): Payer: Self-pay

## 2024-02-01 DIAGNOSIS — G4733 Obstructive sleep apnea (adult) (pediatric): Secondary | ICD-10-CM | POA: Diagnosis not present

## 2024-03-15 DIAGNOSIS — G4733 Obstructive sleep apnea (adult) (pediatric): Secondary | ICD-10-CM | POA: Diagnosis not present

## 2024-03-22 ENCOUNTER — Telehealth: Payer: Self-pay | Admitting: Otolaryngology

## 2024-03-22 NOTE — Telephone Encounter (Signed)
 CALLED PT CONFIRMED ADDRESS MJM

## 2024-03-26 ENCOUNTER — Encounter (INDEPENDENT_AMBULATORY_CARE_PROVIDER_SITE_OTHER): Payer: Self-pay | Admitting: Otolaryngology

## 2024-03-26 ENCOUNTER — Ambulatory Visit (INDEPENDENT_AMBULATORY_CARE_PROVIDER_SITE_OTHER): Admitting: Otolaryngology

## 2024-03-26 VITALS — BP 139/88 | HR 77 | Ht 70.0 in | Wt 228.8 lb

## 2024-03-26 DIAGNOSIS — Z91198 Patient's noncompliance with other medical treatment and regimen for other reason: Secondary | ICD-10-CM

## 2024-03-26 DIAGNOSIS — Z789 Other specified health status: Secondary | ICD-10-CM

## 2024-03-26 DIAGNOSIS — G4733 Obstructive sleep apnea (adult) (pediatric): Secondary | ICD-10-CM

## 2024-03-26 NOTE — H&P (View-Only) (Signed)
 ENT Progress Note:   Update 03/26/2024  Discussed the use of AI scribe software for clinical note transcription with the patient, who gave verbal consent to proceed.  History of Present Illness Dylan Reyes is a 73 year old male with hx of severe sleep apnea who presents for consideration of Inspire implant.  He has been diagnosed with severe sleep apnea based on in-lab PSG at [facility], with an apnea-hypopnea index (AHI) of 42. He continues to struggle with CPAP tolerance at night, due to poor quality of sleep and pressure of the mask on his face when wearing it.  Still needs DISE to determine candidacy for Inspire. Denies being on blood thinner. He is currently taking aspirin  and has no history of stroke or heart attack in the past.   Records Reviewed:  Initial Evaluation  Reason for Consult: OSA Inspire consult   Ref: Benedetto Brady, MD   HPI: Discussed the use of AI scribe software for clinical note transcription with the patient, who gave verbal consent to proceed.  History of Present Illness   The patient is a 23 yoM with long-standing hx of obstructive sleep apnea, presents for an Inspire therapy consultation. He was referred by his primary care doctor for an [facility] therapy consultation.  He has a long-standing history of obstructive sleep apnea, diagnosed over 27 years ago. He has been using a CPAP machine consistently every night since his diagnosis, but reports poor quality of sleep and cannot tolerate the mask on the entire night. Despite trying different types of masks, he has always returned to the full face mask due to being a mouth breather, although he now uses a patch to keep his mouth closed. He averages about four to six hours of sleep per night with the CPAP and then takes the mask off.  He is seeking an alternative to CPAP therapy due to discomfort of wearing a mask and headgear, poor quality of sleep. He has lost over 100 pounds since his last sleep study, which was  conducted approximately ten years ago at [facility]. He does not recall the severity of his sleep apnea from that study. We do not have any records of his old sleep test to review. No insomnia, stroke, or heart attack history.  He has a history of thyroid  cancer, for which he underwent surgery and radioactive iodine treatment. The cancer recurred, and he was treated at [facility] after initial surgery. He has been in remission since 2005 with no signs of recurrence.       Records Reviewed:  OR note from 03/30/2011 - excision of midline bilobular cyst with isthmus tumor Total thyroidectomy and R modified neck dissection levels 2, 3, 4    Past Medical History:  Diagnosis Date   Bowel obstruction (HCC)    Diabetes (HCC)    Dr. Ronelle Coffee   Hypercholesteremia    Hypertension    Thyroid  cancer [facility])    Thyroid  disease    thyroid  cancer 09/02 with radioactive iodine 7/06     Past Surgical History:  Procedure Laterality Date   ABDOMINAL SURGERY     COLON SURGERY     HERNIA REPAIR     LAPAROSCOPIC GASTRIC SLEEVE RESECTION     THYROIDECTOMY      History reviewed. No pertinent family history.  Social History:  reports that he has never smoked. He has never used smokeless tobacco. He reports current alcohol use. He reports that he does not use drugs.  Allergies:  Allergies  Allergen Reactions   Ciprofloxacin Rash    Medications: I have reviewed the patient's current medications.  The PMH, PSH, Medications, Allergies, and SH were reviewed and updated.  ROS: Constitutional: Negative for fever, weight loss and weight gain. Cardiovascular: Negative for chest pain and dyspnea on exertion. Respiratory: Is not experiencing shortness of breath at rest. Gastrointestinal: Negative for nausea and vomiting. Neurological: Negative for headaches. Psychiatric: The patient is not nervous/anxious  Blood pressure 139/88, pulse 77, height 5\' 10"  (1.778 m), weight 228 lb 12.8 oz (103.8 kg), SpO2 95%.  Body mass index is 32.83 kg/m.  PHYSICAL EXAM:  Exam: General: Well-developed, well-nourished Respiratory Respiratory effort: Equal inspiration and expiration without stridor Cardiovascular Peripheral Vascular: Warm extremities with equal color/perfusion Eyes: No nystagmus with equal extraocular motion bilaterally Neuro/Psych/Balance: Patient oriented to person, place, and time; Appropriate mood and affect; Gait is intact with no imbalance; Cranial nerves I-XII are intact Head and Face Inspection: Normocephalic and atraumatic without mass or lesion Facial Strength: Facial motility symmetric and full bilaterally ENT Pinna: External ear intact and fully developed External canal: Canal is patent with intact skin Tympanic Membrane: Clear and mobile External Nose: No scar or anatomic deformity Lips, Teeth, and gums: Mucosa and teeth intact and viable TMJ: No pain to palpation with full mobility Oral cavity/oropharynx: No erythema or exudate, no lesions present Friedman IV tongue position and absent tonsils  Neck Neck and Trachea: Midline trachea without mass or lesion Thyroid : No mass or nodularity Lymphatics: No lymphadenopathy  Procedure: none - was scoped during initial visit   PSG [facility] 03/14/24 in-lab AHI of 42.4  BMI 32.83 today  Central apneas 5 (apnea + hypopnea 105) ~ 4.7%   Assessment/Plan: Encounter Diagnoses  Name Primary?   OSA (obstructive sleep apnea) Yes   Intolerance of continuous positive airway pressure (CPAP) ventilation      Assessment and Plan    Obstructive Sleep Apnea (OSA) CPAP intolerance Long-standing OSA, diagnosed 27 years ago, seeking alternatives due to discomfort and poor quality of sleep with CPAP mask on, despite of multiple trials of various masks. Last sleep study 10 years ago; over 100-pound weight loss since. Interested in [facility] therapy; requires updated sleep study to assess severity and eligibility. Discussed Inspire therapy,  including surgical procedure, eligibility criteria (AHI, BMI, central sleep apneas), and drug-induced sleep endoscopy. Explained procedure involves two incisions, implant is MRI compatible, and is outpatient with same-day discharge. Criteria: AHI >15, BMI 32-38, <25% central sleep apneas. Recovery: avoid strenuous activities for four weeks, device activation after four weeks, follow-up for titration and repeat sleep tests. - Refer to [facility] for updated sleep study - Provide Inspire therapy brochure and information about the [employer] program - Schedule follow-up appointment to review sleep study results and discuss further steps - if candidate for Inspire based on the sleep test, will schedule for DISE  Thyroid  Cancer (Remission) Thyroid  cancer treated with surgery and radioactive iodine. In remission since 2005 with no recurrence. There is an op note from 2012 where thyroidectomy and neck dissections were done by Dr Donalee Fruits.  - No immediate action required  Follow-up - Schedule follow-up appointment after sleep study results are available - referred to [facility]    Update 03/26/2024 Assessment and Plan Assessment & Plan Severe obstructive sleep apnea Severe obstructive sleep apnea confirmed by sleep study with AHI of 42, only 4.7% of central apneas. BMI today was 32.83.  OSA, moderate to severe, without multilevel collapse, with failure to tolerate PAP  therapy and/or more conservative measures. Presence of smaller/absent tonsils and larger tongue position (Friedman tongue position or modified Mallampati) suggests that hypopharyngeal/retrolingual collapse is contributing to the patient's OSA. Reather Campbell, M et al. Staging of obstructive sleep apnea/hypopnea syndrome: a guide to appropriate treatment. Laryngoscope, 2004 Mar, 114(3):454-9. PMID: 16109604) Options including positional therapy, weight loss, oral appliances, PAP and surgical correction discussed. Pt is not an ideal  candidate for oral appliance due to severity of OSA Pt could be a candidate for Hypoglossal nerve stimulation (Inspire therapy) pending results of DISE   Candidate for Inspire implant pending drug-induced sleep endoscopy. - Order drug-induced sleep endoscopy to rule out [facility] - Schedule preoperative visit to discuss Inspire implant procedure details. - Advised him to arrange transportation for the drug-induced sleep endoscopy due to propofol sedation.     Artice Last, MD Otolaryngology [facility] Phone: [phone] Fax: [phone]    03/26/2024, 8:39 AM

## 2024-03-26 NOTE — Progress Notes (Signed)
 ENT Progress Note:   Update 03/26/2024  Discussed the use of AI scribe software for clinical note transcription with the patient, who gave verbal consent to proceed.  History of Present Illness Dylan Reyes is a 73 year old male with hx of severe sleep apnea who presents for consideration of Inspire implant.  He has been diagnosed with severe sleep apnea based on in-lab PSG at [facility], with an apnea-hypopnea index (AHI) of 42. He continues to struggle with CPAP tolerance at night, due to poor quality of sleep and pressure of the mask on his face when wearing it.  Still needs DISE to determine candidacy for Inspire. Denies being on blood thinner. He is currently taking aspirin  and has no history of stroke or heart attack in the past.   Records Reviewed:  Initial Evaluation  Reason for Consult: OSA Inspire consult   Ref: Benedetto Brady, MD   HPI: Discussed the use of AI scribe software for clinical note transcription with the patient, who gave verbal consent to proceed.  History of Present Illness   The patient is a 23 yoM with long-standing hx of obstructive sleep apnea, presents for an Inspire therapy consultation. He was referred by his primary care doctor for an [facility] therapy consultation.  He has a long-standing history of obstructive sleep apnea, diagnosed over 27 years ago. He has been using a CPAP machine consistently every night since his diagnosis, but reports poor quality of sleep and cannot tolerate the mask on the entire night. Despite trying different types of masks, he has always returned to the full face mask due to being a mouth breather, although he now uses a patch to keep his mouth closed. He averages about four to six hours of sleep per night with the CPAP and then takes the mask off.  He is seeking an alternative to CPAP therapy due to discomfort of wearing a mask and headgear, poor quality of sleep. He has lost over 100 pounds since his last sleep study, which was  conducted approximately ten years ago at [facility]. He does not recall the severity of his sleep apnea from that study. We do not have any records of his old sleep test to review. No insomnia, stroke, or heart attack history.  He has a history of thyroid  cancer, for which he underwent surgery and radioactive iodine treatment. The cancer recurred, and he was treated at [facility] after initial surgery. He has been in remission since 2005 with no signs of recurrence.       Records Reviewed:  OR note from 03/30/2011 - excision of midline bilobular cyst with isthmus tumor Total thyroidectomy and R modified neck dissection levels 2, 3, 4    Past Medical History:  Diagnosis Date   Bowel obstruction (HCC)    Diabetes (HCC)    Dr. Ronelle Coffee   Hypercholesteremia    Hypertension    Thyroid  cancer [facility])    Thyroid  disease    thyroid  cancer 09/02 with radioactive iodine 7/06     Past Surgical History:  Procedure Laterality Date   ABDOMINAL SURGERY     COLON SURGERY     HERNIA REPAIR     LAPAROSCOPIC GASTRIC SLEEVE RESECTION     THYROIDECTOMY      History reviewed. No pertinent family history.  Social History:  reports that he has never smoked. He has never used smokeless tobacco. He reports current alcohol use. He reports that he does not use drugs.  Allergies:  Allergies  Allergen Reactions   Ciprofloxacin Rash    Medications: I have reviewed the patient's current medications.  The PMH, PSH, Medications, Allergies, and SH were reviewed and updated.  ROS: Constitutional: Negative for fever, weight loss and weight gain. Cardiovascular: Negative for chest pain and dyspnea on exertion. Respiratory: Is not experiencing shortness of breath at rest. Gastrointestinal: Negative for nausea and vomiting. Neurological: Negative for headaches. Psychiatric: The patient is not nervous/anxious  Blood pressure 139/88, pulse 77, height 5\' 10"  (1.778 m), weight 228 lb 12.8 oz (103.8 kg), SpO2 95%.  Body mass index is 32.83 kg/m.  PHYSICAL EXAM:  Exam: General: Well-developed, well-nourished Respiratory Respiratory effort: Equal inspiration and expiration without stridor Cardiovascular Peripheral Vascular: Warm extremities with equal color/perfusion Eyes: No nystagmus with equal extraocular motion bilaterally Neuro/Psych/Balance: Patient oriented to person, place, and time; Appropriate mood and affect; Gait is intact with no imbalance; Cranial nerves I-XII are intact Head and Face Inspection: Normocephalic and atraumatic without mass or lesion Facial Strength: Facial motility symmetric and full bilaterally ENT Pinna: External ear intact and fully developed External canal: Canal is patent with intact skin Tympanic Membrane: Clear and mobile External Nose: No scar or anatomic deformity Lips, Teeth, and gums: Mucosa and teeth intact and viable TMJ: No pain to palpation with full mobility Oral cavity/oropharynx: No erythema or exudate, no lesions present Friedman IV tongue position and absent tonsils  Neck Neck and Trachea: Midline trachea without mass or lesion Thyroid : No mass or nodularity Lymphatics: No lymphadenopathy  Procedure: none - was scoped during initial visit   PSG [facility] 03/14/24 in-lab AHI of 42.4  BMI 32.83 today  Central apneas 5 (apnea + hypopnea 105) ~ 4.7%   Assessment/Plan: Encounter Diagnoses  Name Primary?   OSA (obstructive sleep apnea) Yes   Intolerance of continuous positive airway pressure (CPAP) ventilation      Assessment and Plan    Obstructive Sleep Apnea (OSA) CPAP intolerance Long-standing OSA, diagnosed 27 years ago, seeking alternatives due to discomfort and poor quality of sleep with CPAP mask on, despite of multiple trials of various masks. Last sleep study 10 years ago; over 100-pound weight loss since. Interested in [facility] therapy; requires updated sleep study to assess severity and eligibility. Discussed Inspire therapy,  including surgical procedure, eligibility criteria (AHI, BMI, central sleep apneas), and drug-induced sleep endoscopy. Explained procedure involves two incisions, implant is MRI compatible, and is outpatient with same-day discharge. Criteria: AHI >15, BMI 32-38, <25% central sleep apneas. Recovery: avoid strenuous activities for four weeks, device activation after four weeks, follow-up for titration and repeat sleep tests. - Refer to [facility] for updated sleep study - Provide Inspire therapy brochure and information about the [employer] program - Schedule follow-up appointment to review sleep study results and discuss further steps - if candidate for Inspire based on the sleep test, will schedule for DISE  Thyroid  Cancer (Remission) Thyroid  cancer treated with surgery and radioactive iodine. In remission since 2005 with no recurrence. There is an op note from 2012 where thyroidectomy and neck dissections were done by Dr Donalee Fruits.  - No immediate action required  Follow-up - Schedule follow-up appointment after sleep study results are available - referred to [facility]    Update 03/26/2024 Assessment and Plan Assessment & Plan Severe obstructive sleep apnea Severe obstructive sleep apnea confirmed by sleep study with AHI of 42, only 4.7% of central apneas. BMI today was 32.83.  OSA, moderate to severe, without multilevel collapse, with failure to tolerate PAP  therapy and/or more conservative measures. Presence of smaller/absent tonsils and larger tongue position (Friedman tongue position or modified Mallampati) suggests that hypopharyngeal/retrolingual collapse is contributing to the patient's OSA. Reather Campbell, M et al. Staging of obstructive sleep apnea/hypopnea syndrome: a guide to appropriate treatment. Laryngoscope, 2004 Mar, 114(3):454-9. PMID: 16109604) Options including positional therapy, weight loss, oral appliances, PAP and surgical correction discussed. Pt is not an ideal  candidate for oral appliance due to severity of OSA Pt could be a candidate for Hypoglossal nerve stimulation (Inspire therapy) pending results of DISE   Candidate for Inspire implant pending drug-induced sleep endoscopy. - Order drug-induced sleep endoscopy to rule out [facility] - Schedule preoperative visit to discuss Inspire implant procedure details. - Advised him to arrange transportation for the drug-induced sleep endoscopy due to propofol sedation.     Artice Last, MD Otolaryngology [facility] Phone: [phone] Fax: [phone]    03/26/2024, 8:39 AM

## 2024-04-03 ENCOUNTER — Other Ambulatory Visit: Payer: Self-pay

## 2024-04-03 ENCOUNTER — Encounter (HOSPITAL_COMMUNITY): Payer: Self-pay | Admitting: *Deleted

## 2024-04-03 NOTE — Anesthesia Preprocedure Evaluation (Addendum)
 Anesthesia Evaluation  Patient identified by MRN, date of birth, ID band Patient awake    Reviewed: Allergy & Precautions, NPO status , Patient's Chart, lab work & pertinent test results  Airway Mallampati: II  TM Distance: >3 FB Neck ROM: Full    Dental no notable dental hx. (+) Teeth Intact, Dental Advisory Given   Pulmonary sleep apnea and Continuous Positive Airway Pressure Ventilation    Pulmonary exam normal breath sounds clear to auscultation       Cardiovascular hypertension, Pt. on medications Normal cardiovascular exam Rhythm:Regular Rate:Normal     Neuro/Psych negative neurological ROS  negative psych ROS   GI/Hepatic negative GI ROS, Neg liver ROS,,,  Endo/Other  diabetes, Type 2, Oral Hypoglycemic AgentsHypothyroidism    Renal/GU negative Renal ROS  negative genitourinary   Musculoskeletal negative musculoskeletal ROS (+)    Abdominal   Peds  Hematology negative hematology ROS (+)   Anesthesia Other Findings   Reproductive/Obstetrics                             Anesthesia Physical Anesthesia Plan  ASA: 3  Anesthesia Plan: MAC   Post-op Pain Management:    Induction: Intravenous  PONV Risk Score and Plan: Propofol infusion and Treatment may vary due to age or medical condition  Airway Management Planned: Natural Airway  Additional Equipment:   Intra-op Plan:   Post-operative Plan:   Informed Consent: I have reviewed the patients History and Physical, chart, labs and discussed the procedure including the risks, benefits and alternatives for the proposed anesthesia with the patient or authorized representative who has indicated his/her understanding and acceptance.     Dental advisory given  Plan Discussed with: CRNA  Anesthesia Plan Comments:        Anesthesia Quick Evaluation

## 2024-04-03 NOTE — Pre-Procedure Instructions (Signed)
 -------------  SDW INSTRUCTIONS given:  Your procedure is scheduled on 5/21.  Report to [facility] Main Entrance "A" at 06:30 A.M., and check in at the Admitting office.  Any questions or running late day of surgery: call [phone]    Remember:  Do not eat or drink after midnight the night before your surgery    Take these medicines the morning of surgery with A SIP OF WATER  levothyroxine         May take these medicines IF NEEDED: tylenol    Follow your surgeon's instructions on when to stop Aspirin .  If no instructions were given by your surgeon then you will need to call the office to get those instructions.    As of today, STOP taking any Aleve, Naproxen, Ibuprofen , Motrin , Advil , Goody's, BC's, all herbal medications, fish oil, and all vitamins.  WHAT DO I DO ABOUT MY DIABETES MEDICATION?  STOP taking Jardiance 3 days prior to surgery. Last dose 5/17     STOP taking Mounjaro 7 days prior to surgery. Last dose 5/13.    HOW TO MANAGE YOUR DIABETES BEFORE AND AFTER SURGERY  Why is it important to control my blood sugar before and after surgery? Improving blood sugar levels before and after surgery helps healing and can limit problems. A way of improving blood sugar control is eating a healthy diet by:  Eating less sugar and carbohydrates  Increasing activity/exercise  Talking with your doctor about reaching your blood sugar goals High blood sugars (greater than 180 mg/dL) can raise your risk of infections and slow your recovery, so you will need to focus on controlling your diabetes during the weeks before surgery. Make sure that the doctor who takes care of your diabetes knows about your planned surgery including the date and location.  How do I manage my blood sugar before surgery? Check your blood sugar at least 4 times a day, starting 2 days before surgery, to make sure that the level is not too high or low.  Check your blood sugar the morning of your surgery when  you wake up and every 2 hours until you get to the Short Stay unit.  If your blood sugar is less than 70 mg/dL, you will need to treat for low blood sugar: Do not take insulin . Treat a low blood sugar (less than 70 mg/dL) with  cup of clear juice (cranberry or apple), 4 glucose tablets, OR glucose gel. Recheck blood sugar in 15 minutes after treatment (to make sure it is greater than 70 mg/dL). If your blood sugar is not greater than 70 mg/dL on recheck, call [phone] for further instructions. Report your blood sugar to the short stay nurse when you get to Short Stay.  If you are admitted to the hospital after surgery: Your blood sugar will be checked by the staff and you will probably be given insulin  after surgery (instead of oral diabetes medicines) to make sure you have good blood sugar levels. The goal for blood sugar control after surgery is 80-180 mg/dL.   Do NOT Smoke (Tobacco/Vaping) 24 hours prior to your procedure  If you use a CPAP at night, you may bring all equipment for your overnight stay.     You will be asked to remove any contacts, glasses, piercing's, hearing aid's, dentures/partials prior to surgery. Please bring cases for these items if needed.     Patients discharged the day of surgery will not be allowed to drive home, and someone needs to stay  with them for 24 hours.  SURGICAL WAITING ROOM VISITATION Patients may have no more than 2 support people in the waiting area - these visitors may rotate.   Pre-op nurse will coordinate an appropriate time for 1 ADULT support person, who may not rotate, to accompany patient in pre-op.  Children under the age of 103 must have an adult with them who is not the patient and must remain in the main waiting area with an adult.  If the patient needs to stay at the hospital during part of their recovery, the visitor guidelines for inpatient rooms apply.  Please refer to the [facility] website for the visitor guidelines for any  additional information.   Special instructions:   [place]- Preparing For Surgery   Please follow these instructions carefully.   Shower the NIGHT BEFORE SURGERY and the MORNING OF SURGERY with DIAL Soap.   Pat yourself dry with a CLEAN TOWEL.  Wear CLEAN PAJAMAS to bed the night before surgery  Place CLEAN SHEETS on your bed the night of your first shower and DO NOT SLEEP WITH PETS.   Additional instructions for the day of surgery: DO NOT APPLY any lotions, deodorants, cologne, or perfumes.   Do not wear jewelry or makeup Do not wear nail polish, gel polish, artificial nails, or any other type of covering on natural nails (fingers and toes) Do not bring valuables to the hospital. [facility] is not responsible for valuables/personal belongings. Put on clean/comfortable clothes.  Please brush your teeth.  Ask your nurse before applying any prescription medications to the skin.

## 2024-04-03 NOTE — Progress Notes (Signed)
 PCP - Dr. Benedetto Brady Cardiologist - denies  PPM/ICD - denies   Chest x-ray - 04/21/16 EKG - 08/30/21- will need DOS Stress Test - denies ECHO - denies Cardiac Cath - denies  CPAP - OSA+, CPAP nightly  Fasting Blood Sugar - 105-125 Checks Blood Sugar once/day   -Last dose Jardiance- 5/20 (pt was not told to hold 72 hours, but will hold further doses prior to surgery - Last dose Mounjaro 5/16 (Pt was told to stop that day)   Blood Thinner Instructions: n/a Aspirin  Instructions: f/u with surgeon  ERAS Protcol - no, NPO  COVID TEST- n/a  Anesthesia review: no  Patient verbally denies any shortness of breath, fever, cough and chest pain during phone call     Questions were answered. Patient verbalized understanding of instructions.

## 2024-04-04 ENCOUNTER — Encounter (HOSPITAL_COMMUNITY): Payer: Self-pay | Admitting: *Deleted

## 2024-04-04 ENCOUNTER — Encounter (HOSPITAL_COMMUNITY)
Admission: RE | Disposition: A | Discharge status: Home / Self Care | Payer: Self-pay | Source: Home / Self Care | Attending: Otolaryngology

## 2024-04-04 ENCOUNTER — Ambulatory Visit (HOSPITAL_COMMUNITY): Admitting: Anesthesiology

## 2024-04-04 ENCOUNTER — Ambulatory Visit (HOSPITAL_COMMUNITY)
Admission: RE | Admit: 2024-04-04 | Discharge: 2024-04-04 | Disposition: A | Discharge status: Home / Self Care | Attending: Otolaryngology | Admitting: Otolaryngology

## 2024-04-04 ENCOUNTER — Other Ambulatory Visit: Payer: Self-pay

## 2024-04-04 ENCOUNTER — Ambulatory Visit (HOSPITAL_BASED_OUTPATIENT_CLINIC_OR_DEPARTMENT_OTHER): Admitting: Anesthesiology

## 2024-04-04 DIAGNOSIS — Z789 Other specified health status: Secondary | ICD-10-CM

## 2024-04-04 DIAGNOSIS — E119 Type 2 diabetes mellitus without complications: Secondary | ICD-10-CM | POA: Diagnosis not present

## 2024-04-04 DIAGNOSIS — E66811 Obesity, class 1: Secondary | ICD-10-CM | POA: Diagnosis not present

## 2024-04-04 DIAGNOSIS — E039 Hypothyroidism, unspecified: Secondary | ICD-10-CM | POA: Insufficient documentation

## 2024-04-04 DIAGNOSIS — Z7982 Long term (current) use of aspirin: Secondary | ICD-10-CM | POA: Insufficient documentation

## 2024-04-04 DIAGNOSIS — G4733 Obstructive sleep apnea (adult) (pediatric): Secondary | ICD-10-CM | POA: Diagnosis not present

## 2024-04-04 DIAGNOSIS — Z7984 Long term (current) use of oral hypoglycemic drugs: Secondary | ICD-10-CM

## 2024-04-04 DIAGNOSIS — Z6832 Body mass index (BMI) 32.0-32.9, adult: Secondary | ICD-10-CM | POA: Insufficient documentation

## 2024-04-04 DIAGNOSIS — I10 Essential (primary) hypertension: Secondary | ICD-10-CM

## 2024-04-04 DIAGNOSIS — Z85850 Personal history of malignant neoplasm of thyroid: Secondary | ICD-10-CM | POA: Diagnosis not present

## 2024-04-04 HISTORY — DX: Personal history of urinary calculi: Z87.442

## 2024-04-04 HISTORY — DX: Sleep apnea, unspecified: G47.30

## 2024-04-04 HISTORY — PX: DRUG INDUCED ENDOSCOPY: SHX6808

## 2024-04-04 LAB — GLUCOSE, CAPILLARY
Glucose-Capillary: 155 mg/dL — ABNORMAL HIGH (ref 70–99)
Glucose-Capillary: 143 mg/dL — ABNORMAL HIGH (ref 70–99)

## 2024-04-04 LAB — CBC
HCT: 48.6 % (ref 39.0–52.0)
Hemoglobin: 16.4 g/dL (ref 13.0–17.0)
MCH: 32.5 pg (ref 26.0–34.0)
MCHC: 33.7 g/dL (ref 30.0–36.0)
MCV: 96.4 fL (ref 80.0–100.0)
Platelets: 246 10*3/uL (ref 150–400)
RBC: 5.04 MIL/uL (ref 4.22–5.81)
RDW: 13.2 % (ref 11.5–15.5)
WBC: 5.4 10*3/uL (ref 4.0–10.5)
nRBC: 0 % (ref 0.0–0.2)

## 2024-04-04 LAB — POCT I-STAT, CHEM 8
BUN: 16 mg/dL (ref 8–23)
Calcium, Ion: 1.06 mmol/L — ABNORMAL LOW (ref 1.15–1.40)
Chloride: 104 mmol/L (ref 98–111)
Creatinine, Ser: 1.1 mg/dL (ref 0.61–1.24)
Glucose, Bld: 138 mg/dL — ABNORMAL HIGH (ref 70–99)
HCT: 47 % (ref 39.0–52.0)
Hemoglobin: 16 g/dL (ref 13.0–17.0)
Potassium: 4.4 mmol/L (ref 3.5–5.1)
Sodium: 138 mmol/L (ref 135–145)
TCO2: 27 mmol/L (ref 22–32)

## 2024-04-04 SURGERY — DRUG INDUCED SLEEP ENDOSCOPY
Anesthesia: Monitor Anesthesia Care | Site: Nose

## 2024-04-04 MED ORDER — ONDANSETRON HCL 4 MG/2ML IJ SOLN
INTRAMUSCULAR | Status: AC
  Filled 2024-04-04: qty 2

## 2024-04-04 MED ORDER — OXYMETAZOLINE HCL 0.05 % NA SOLN
NASAL | Status: AC
  Filled 2024-04-04: qty 30

## 2024-04-04 MED ORDER — LIDOCAINE 2% (20 MG/ML) 5 ML SYRINGE
INTRAMUSCULAR | Status: DC | PRN
  Administered 2024-04-04: 80 mg via INTRAVENOUS

## 2024-04-04 MED ORDER — LACTATED RINGERS IV SOLN: INTRAVENOUS | Status: DC

## 2024-04-04 MED ORDER — OXYMETAZOLINE HCL 0.05 % NA SOLN
NASAL | Status: DC | PRN
  Administered 2024-04-04: 1 via TOPICAL

## 2024-04-04 MED ORDER — LIDOCAINE-EPINEPHRINE 1 %-1:100000 IJ SOLN
INTRAMUSCULAR | Status: AC
  Filled 2024-04-04: qty 1

## 2024-04-04 MED ORDER — INSULIN ASPART 100 UNIT/ML IJ SOLN: 0.0000 [IU] | INTRAMUSCULAR | Status: DC | PRN

## 2024-04-04 MED ORDER — PROPOFOL 500 MG/50ML IV EMUL
INTRAVENOUS | Status: DC | PRN
  Administered 2024-04-04: 150 ug/kg/min via INTRAVENOUS

## 2024-04-04 MED ORDER — SODIUM CHLORIDE (PF) 0.9 % IJ SOLN
INTRAMUSCULAR | Status: AC
  Filled 2024-04-04: qty 10

## 2024-04-04 MED ORDER — DEXAMETHASONE SODIUM PHOSPHATE 10 MG/ML IJ SOLN
INTRAMUSCULAR | Status: AC
  Filled 2024-04-04: qty 1

## 2024-04-04 MED ORDER — MIDAZOLAM HCL 2 MG/2ML IJ SOLN
INTRAMUSCULAR | Status: AC
  Filled 2024-04-04: qty 2

## 2024-04-04 MED ORDER — ORAL CARE MOUTH RINSE: 15.0000 mL | Freq: Once | OROMUCOSAL | Status: AC

## 2024-04-04 MED ORDER — PROPOFOL 10 MG/ML IV BOLUS
INTRAVENOUS | Status: DC | PRN
  Administered 2024-04-04: 150 mg via INTRAVENOUS

## 2024-04-04 MED ORDER — LIDOCAINE 2% (20 MG/ML) 5 ML SYRINGE
INTRAMUSCULAR | Status: AC
  Filled 2024-04-04: qty 5

## 2024-04-04 MED ORDER — PHENYLEPHRINE 80 MCG/ML (10ML) SYRINGE FOR IV PUSH (FOR BLOOD PRESSURE SUPPORT)
PREFILLED_SYRINGE | INTRAVENOUS | Status: AC
  Filled 2024-04-04: qty 10

## 2024-04-04 MED ORDER — LIDOCAINE-EPINEPHRINE 1 %-1:100000 IJ SOLN
INTRAMUSCULAR | Status: DC | PRN
  Administered 2024-04-04: 20 mL

## 2024-04-04 MED ORDER — CHLORHEXIDINE GLUCONATE 0.12 % MT SOLN
15.0000 mL | Freq: Once | OROMUCOSAL | Status: AC
  Administered 2024-04-04: 15 mL via OROMUCOSAL
  Filled 2024-04-04: qty 15

## 2024-04-04 SURGICAL SUPPLY — 2 items
BRONCHOSCOPE PED SLIM DISP (MISCELLANEOUS) IMPLANT
SOL ANTI FOG 6CC (MISCELLANEOUS) ×1 IMPLANT

## 2024-04-04 NOTE — Discharge Instructions (Signed)
 Patient Discharge Instructions   Based on results of your drug-induced sleep endoscopy you ARE NOT a candidate for Inspire Implant therapy  What can I expect after surgery?  The recovery period after this procedure is generally smooth and  uncomplicated. However, an occasional situation may arise which can be  distressing for you as a patient, or a family member. The following general  information and suggestions may help you through this period.  How will I feel after surgery?   A sore throat is common following this surgery and may last for 1 week.   Sores in the mouth, or swelling of the lips may occur, and should resolve  within one week.   You may experience some nausea/vomiting which should improve in the  first day.   You may have low-grade fever, less than 100 degrees for a few days.  Are there diet restrictions after surgery?  Liquid is essential. Start with ice chips, sips of water, or your favorite  fruit juice drink, then progress to at least an 8 ounce glass of liquid per  hour until you are able to tolerate solid food.   If swallowing liquids causes chest pain or shortness of breath, please  contact your doctor immediately.  Cold liquids, non-acidic juices, sherbet ice cream, and Popsicles are  tolerated better within the first 24 hour period. You may then progress to  soft foods gradually (Jell-O, custard, soft boiled or scrambled eggs,  pudding, mashed potatoes).   Drink lots of fluids to keep your throat moist.   Avoid acidic foods and juices, (orange, tomato) salty, and fried foods (potato chips, [place] fries, hard toast, and popcorn). Are there activity restrictions after surgery?  Rest with limited activity at home for 24-72 hours.   Avoid lifting (greater than 10 pounds), straining, or vigorous activities.  You may return to work/school in generally after 1 to 2 days.  How do I manage pain after surgery?  You will not have pain after this procedure   When  should I call my doctor?  If you have chest pain, lightheadedness within the first 48 hours or upon  standing.  If you have difficulty breathing inability to swallow.  If you have persistent vomiting.  If you have progressive low neck pain.  If you have an oral temperature over 100.5 degrees. Check to make sure  they are getting enough liquids. Dehydration can cause the body  temperature to rise.  You can resume all of your home medications after this surgery.

## 2024-04-04 NOTE — Transfer of Care (Signed)
 Immediate Anesthesia Transfer of Care Note  Patient: ODYN TURKO  Procedure(s) Performed: DRUG INDUCED SLEEP ENDOSCOPY (Nose)  Patient Location: PACU  Anesthesia Type:MAC  Level of Consciousness: awake, alert , oriented, and patient cooperative  Airway & Oxygen Therapy: Patient Spontanous Breathing  Post-op Assessment: Report given to RN, Post -op Vital signs reviewed and stable, Patient moving all extremities X 4, and Patient able to stick tongue midline  Post vital signs: Reviewed and stable  Last Vitals:  Vitals Value Taken Time  BP 129/72 04/04/24 0847  Temp 97.8   Pulse 67 04/04/24 0848  Resp 12 04/04/24 0848  SpO2 96 % 04/04/24 0848  Vitals shown include unfiled device data.  Last Pain:  Vitals:   04/04/24 0704  TempSrc:   PainSc: 0-No pain      Patients Stated Pain Goal: 0 (04/04/24 0704)  Complications: No notable events documented.

## 2024-04-04 NOTE — Anesthesia Postprocedure Evaluation (Signed)
 Anesthesia Post Note  Patient: Dylan Reyes  Procedure(s) Performed: DRUG INDUCED SLEEP ENDOSCOPY (Nose)     Patient location during evaluation: PACU Anesthesia Type: MAC Level of consciousness: awake and alert Pain management: pain level controlled Vital Signs Assessment: post-procedure vital signs reviewed and stable Respiratory status: spontaneous breathing, nonlabored ventilation, respiratory function stable and patient connected to nasal cannula oxygen Cardiovascular status: stable and blood pressure returned to baseline Postop Assessment: no apparent nausea or vomiting Anesthetic complications: no  No notable events documented.  Last Vitals:  Vitals:   04/04/24 0900 04/04/24 0915  BP: (!) 132/91 (!) 123/101  Pulse: 64 63  Resp: 14 19  Temp:  36.6 C  SpO2: 95% 96%    Last Pain:  Vitals:   04/04/24 0900  TempSrc:   PainSc: 0-No pain                 Layten Aiken L Marko Skalski

## 2024-04-04 NOTE — Interval H&P Note (Signed)
 History and Physical Interval Note:  04/04/2024 7:52 AM  Dylan Reyes  has presented today for surgery, with the diagnosis of Obstructive sleep apnea.  The various methods of treatment have been discussed with the patient and family. After consideration of risks, benefits and other options for treatment, the patient has consented to  Procedure(s): DRUG INDUCED SLEEP ENDOSCOPY (N/A) as a surgical intervention.  The patient's history has been reviewed, patient examined, no change in status, stable for surgery.  I have reviewed the patient's chart and labs.  Questions were answered to the patient's satisfaction.     Pammy Vesey

## 2024-04-04 NOTE — Anesthesia Procedure Notes (Signed)
 Procedure Name: MAC Date/Time: 04/04/2024 8:25 AM  Performed by: Marcina Severe T, CRNAComments: Nasal cannula

## 2024-04-04 NOTE — Op Note (Signed)
 Operative note  Preoperative diagnosis: OSA Postoperative diagnosis:   Same  Procedure:DISE ( Drug induced sleep endoscopy)  Anesthesia: Topical lidocaine gel Complications: None Condition is stable throughout exam  Indications and consent:   The patient presents to my otolaryngology clinic with a chief complaint of OSA  Because of pt's OSA and desire to be a candidate for Inspire therapy, it was recommended that they undergo a flexible fiberoptic laryngoscopy under sedation (DISE).   All the risks, benefits, and potential complications were reviewed with the patient preoperatively and informed consent was obtained.  Procedure: Pt was brought back to the OR and laid supine on the table. Propofol anesthesia was administered per protocol until pt reached optimal level of sedation. DISE exam showed the following anatomic collapse pattern.  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

## 2024-04-05 ENCOUNTER — Encounter (HOSPITAL_COMMUNITY): Payer: Self-pay | Admitting: Otolaryngology

## 2024-05-15 DIAGNOSIS — H8113 Benign paroxysmal vertigo, bilateral: Secondary | ICD-10-CM | POA: Diagnosis not present

## 2024-07-12 DIAGNOSIS — E1165 Type 2 diabetes mellitus with hyperglycemia: Secondary | ICD-10-CM | POA: Diagnosis not present

## 2024-07-12 DIAGNOSIS — E890 Postprocedural hypothyroidism: Secondary | ICD-10-CM | POA: Diagnosis not present

## 2024-07-12 DIAGNOSIS — E7800 Pure hypercholesterolemia, unspecified: Secondary | ICD-10-CM | POA: Diagnosis not present

## 2024-07-19 ENCOUNTER — Other Ambulatory Visit (HOSPITAL_BASED_OUTPATIENT_CLINIC_OR_DEPARTMENT_OTHER): Payer: Self-pay | Admitting: Endocrinology

## 2024-07-19 DIAGNOSIS — Z85850 Personal history of malignant neoplasm of thyroid: Secondary | ICD-10-CM | POA: Diagnosis not present

## 2024-07-19 DIAGNOSIS — I10 Essential (primary) hypertension: Secondary | ICD-10-CM | POA: Diagnosis not present

## 2024-07-19 DIAGNOSIS — E890 Postprocedural hypothyroidism: Secondary | ICD-10-CM | POA: Diagnosis not present

## 2024-07-19 DIAGNOSIS — E538 Deficiency of other specified B group vitamins: Secondary | ICD-10-CM | POA: Diagnosis not present

## 2024-07-19 DIAGNOSIS — E1165 Type 2 diabetes mellitus with hyperglycemia: Secondary | ICD-10-CM | POA: Diagnosis not present

## 2024-07-19 DIAGNOSIS — E782 Mixed hyperlipidemia: Secondary | ICD-10-CM | POA: Diagnosis not present

## 2024-07-19 DIAGNOSIS — C73 Malignant neoplasm of thyroid gland: Secondary | ICD-10-CM | POA: Diagnosis not present

## 2024-07-19 DIAGNOSIS — E039 Hypothyroidism, unspecified: Secondary | ICD-10-CM | POA: Diagnosis not present

## 2024-07-19 DIAGNOSIS — E7800 Pure hypercholesterolemia, unspecified: Secondary | ICD-10-CM | POA: Diagnosis not present

## 2024-07-19 DIAGNOSIS — G4733 Obstructive sleep apnea (adult) (pediatric): Secondary | ICD-10-CM | POA: Diagnosis not present

## 2024-07-19 DIAGNOSIS — L309 Dermatitis, unspecified: Secondary | ICD-10-CM | POA: Diagnosis not present

## 2024-07-19 DIAGNOSIS — M2550 Pain in unspecified joint: Secondary | ICD-10-CM | POA: Diagnosis not present

## 2024-07-19 DIAGNOSIS — Z9884 Bariatric surgery status: Secondary | ICD-10-CM | POA: Diagnosis not present

## 2024-08-09 ENCOUNTER — Other Ambulatory Visit (HOSPITAL_BASED_OUTPATIENT_CLINIC_OR_DEPARTMENT_OTHER): Payer: Self-pay

## 2024-08-09 ENCOUNTER — Ambulatory Visit (HOSPITAL_BASED_OUTPATIENT_CLINIC_OR_DEPARTMENT_OTHER)
Admission: RE | Admit: 2024-08-09 | Discharge: 2024-08-09 | Disposition: A | Discharge status: Home / Self Care | Payer: Self-pay | Source: Ambulatory Visit | Attending: Endocrinology | Admitting: Endocrinology

## 2024-08-09 DIAGNOSIS — E1165 Type 2 diabetes mellitus with hyperglycemia: Secondary | ICD-10-CM | POA: Insufficient documentation

## 2024-08-09 MED ORDER — FLUZONE HIGH-DOSE 0.5 ML IM SUSY
0.5000 mL | PREFILLED_SYRINGE | Freq: Once | INTRAMUSCULAR | 0 refills | Status: AC
Start: 2024-08-09 — End: 2024-08-10
  Filled 2024-08-09: qty 0.5, 1d supply, fill #0

## 2024-08-09 MED ORDER — COMIRNATY 30 MCG/0.3ML IM SUSY
0.3000 mL | PREFILLED_SYRINGE | Freq: Once | INTRAMUSCULAR | 0 refills | Status: AC
  Filled 2024-08-09: qty 0.3, 1d supply, fill #0

## 2024-10-01 ENCOUNTER — Encounter (HOSPITAL_BASED_OUTPATIENT_CLINIC_OR_DEPARTMENT_OTHER): Payer: Self-pay | Admitting: Nurse Practitioner

## 2024-10-01 ENCOUNTER — Ambulatory Visit (HOSPITAL_BASED_OUTPATIENT_CLINIC_OR_DEPARTMENT_OTHER): Admitting: Nurse Practitioner

## 2024-10-01 VITALS — BP 122/74 | HR 75 | Ht 70.0 in | Wt 217.4 lb

## 2024-10-01 DIAGNOSIS — I10 Essential (primary) hypertension: Secondary | ICD-10-CM | POA: Diagnosis not present

## 2024-10-01 DIAGNOSIS — E118 Type 2 diabetes mellitus with unspecified complications: Secondary | ICD-10-CM

## 2024-10-01 DIAGNOSIS — R931 Abnormal findings on diagnostic imaging of heart and coronary circulation: Secondary | ICD-10-CM

## 2024-10-01 DIAGNOSIS — E785 Hyperlipidemia, unspecified: Secondary | ICD-10-CM | POA: Diagnosis not present

## 2024-10-01 DIAGNOSIS — G4733 Obstructive sleep apnea (adult) (pediatric): Secondary | ICD-10-CM

## 2024-10-01 DIAGNOSIS — Z7189 Other specified counseling: Secondary | ICD-10-CM

## 2024-10-01 MED ORDER — METOPROLOL TARTRATE 50 MG PO TABS: 50.0000 mg | ORAL_TABLET | Freq: Once | ORAL | 0 refills | Status: DC

## 2024-10-01 MED ORDER — METOPROLOL TARTRATE 50 MG PO TABS: 50.0000 mg | ORAL_TABLET | Freq: Once | ORAL | 0 refills | Status: AC

## 2024-10-01 NOTE — Patient Instructions (Signed)
 Medication Instructions:   Your physician recommends that you continue on your current medications as directed. Please refer to the Current Medication list given to you today.   *If you need a refill on your cardiac medications before your next appointment, please call your pharmacy*  Lab Work:  Your physician recommends that you return for a FASTING NMR/LPa in [place], fasting after midnight.    If you have labs (blood work) drawn today and your tests are completely normal, you will receive your results only by: MyChart Message (if you have MyChart) OR A paper copy in the mail If you have any lab test that is abnormal or we need to change your treatment, we will call you to review the results.  Testing/Procedures:    Your cardiac CT will be scheduled at one of the below locations:     [facility] [address]  [place], [place] [address]   If scheduled at the [facility] at [address], please enter the parking lot using the [address] entrance and use the FREE valet service at the patient drop-off area. Enter the building and check-in with registration on the main floor.  Please follow these instructions carefully (unless otherwise directed):  An IV will be required for this test and Nitroglycerin will be given.  On the Night Before the Test: Be sure to Drink plenty of water. Do not consume any caffeinated/decaffeinated beverages or chocolate 12 hours prior to your test. Do not take any antihistamines 12 hours prior to your test.  On the Day of the Test: Drink plenty of water until 1 hour prior to the test. Do not eat any food 1 hour prior to test. You may take your regular medications prior to the test.  Take metoprolol (Lopressor) one (1)  tablet by mouth ( 50 mg) two hours prior to test. Patients who wear a continuous glucose monitor MUST remove the device prior to scanning. FEMALES- please wear underwire-free bra  if available, avoid dresses & tight clothing     After the Test: Drink plenty of water. After receiving IV contrast, you may experience a mild flushed feeling. This is normal. On occasion, you may experience a mild rash up to 24 hours after the test. This is not dangerous. If this occurs, you can take Benadryl  25 mg, Zyrtec, Claritin, or Allegra and increase your fluid intake. (Patients taking Tikosyn should avoid Benadryl , and may take Zyrtec, Claritin, or Allegra) If you experience trouble breathing, this can be serious. If it is severe call 911 IMMEDIATELY. If it is mild, please call our office.  We will call to schedule your test 2-4 weeks out understanding that some insurance companies will need an authorization prior to the service being performed.   For more information and frequently asked questions, please visit our website : [email or web address]  For non-scheduling related questions, please contact the cardiac imaging nurse navigator should you have any questions/concerns: Cardiac Imaging Nurse Navigators Direct Office Dial: [phone]   For scheduling needs, including cancellations and rescheduling, please call Brittany, [phone].   Follow-Up: At [facility], you and your health needs are our priority.  As part of our continuing mission to provide you with exceptional heart care, our providers are all part of one team.  This team includes your primary Cardiologist (physician) and Advanced Practice Providers or APPs (Physician Assistants and Nurse Practitioners) who all work together to provide you with the care you need, when you need  it.  Your next appointment:   3 month(s)  Provider:   Rosaline Bane, NP    We recommend signing up for the patient portal called MyChart.  Sign up information is provided on this After Visit Summary.  MyChart is used to connect with patients for Virtual Visits (Telemedicine).  Patients are able to view lab/test  results, encounter notes, upcoming appointments, etc.  Non-urgent messages can be sent to your provider as well.   To learn more about what you can do with MyChart, go to [email or web address].   Other Instructions  Adopting a Healthy Lifestyle.   Weight: Know what a healthy weight is for you (roughly BMI <25) and aim to maintain this. You can calculate your body mass index on your smart phone. Unfortunately, this is not the most accurate measure of healthy weight, but it is the simplest measurement to use. A more accurate measurement involves body scanning which measures lean muscle, fat tissue and bony density. We do not have this equipment at [facility].    Diet: Aim for 7+ servings of fruits and vegetables daily Limit animal fats in diet for cholesterol and heart health - choose grass fed whenever available Avoid highly processed foods (fast food burgers, tacos, fried chicken, pizza, hot dogs, french fries)  Saturated fat comes in the form of butter, lard, coconut oil, margarine, partially hydrogenated oils, dairy products, and fat in meat. These increase your risk of cardiovascular disease.  Use healthy plant oils, such as olive, canola, soy, corn, sunflower and peanut.  Whole foods such as fruits, vegetables and whole grains have fiber  Men need > 38 grams of fiber per day Women need > 25 grams of fiber per day  Load up on vegetables and fruits - one-half of your plate: Aim for color and variety, and remember that potatoes dont count. Go for whole grains - one-quarter of your plate: Whole wheat, barley, wheat berries, quinoa, oats, brown rice, and foods made with them. If you want pasta, go with whole wheat pasta. Protein power - one-quarter of your plate: Fish, chicken, beans, and nuts are all healthy, versatile protein sources. Limit red meat. You need carbohydrates for energy! The type of carbohydrate is more important than the amount. Choose carbohydrates such as vegetables, fruits,  whole grains, beans, and nuts in the place of white rice, white pasta, potatoes (baked or fried), macaroni and cheese, cakes, cookies, and donuts.  If youre thirsty, drink water. Coffee and tea are good in moderation, but skip sugary drinks and limit milk and dairy products to one or two daily servings. Keep sugar intake at 6 teaspoons or 24 grams or LESS       Exercise: Aim for 150 min of moderate intensity exercise weekly for heart health, and weights twice weekly for bone health Stay active - any steps are better than no steps! Aim for 7-9 hours of sleep daily   Sleep: This provides your body with the reset and relaxation that it needs!  Aim to get 7-8 hours of sleep each night. Limit caffeine, screen time, and other distractions prior to bedtime.  Keep your bedroom cool and dark and do not wear heavy clothing to bed or use heavy bed covers - layer if needed.

## 2024-10-01 NOTE — Progress Notes (Signed)
 Cardiology Office Note:  .   Date:  10/01/2024 ID:  Dylan Reyes, DOB July 06, 1951, MRN [id number] PCP: Dylan Reyes.Addie, MD (Inactive) [facility] Providers Cardiologist:  None   Patient Profile: .      PMH Hypertension Type 2 diabetes Aortic atherosclerosis Hepatic steatosis OSA on CPAP Possibly familial hyperlipidemia with LDL > 190 Hypothyroidism Metastatic papillary thyroid  cancer Morbid obesity Coronary artery disease CT Calcium score 08/09/24 CAC Score 1295 (88th percentile) LM 0, LAD 142, LCx 220, RCA 932       History of Present Illness: .    Discussed the use of AI scribe software for clinical note transcription with the patient, who gave verbal consent to proceed.  History of Present Illness Dylan Reyes is a very pleasant 73 year old male who is here today as a new patient for evaluation of coronary artery disease. His coronary artery calcification score is 1295, placing him in the 88th percentile for sex/age matched controls.  He reports significant family history of heart disease with his father and paternal grandfather having died of massive heart attacks in their 40s.  He reports he is active at home but no regular exercise.  He was started on Repatha by Dr. Balan and was switched from rosuvastatin to simvastatin due to concern for muscle weakness.  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  He does not feel comfortable climbing up and down ladders due to leg weakness.  He also notes cold extremities.  There was concern that rosuvastatin may have contributed to leg weakness, and he does report slight improvement.  No problems with simvastatin are reported. His lifestyle includes avid gardening and occasional walking, particularly [place]. His diet includes a substantial breakfast including 2 hard-boiled eggs and a piece of bacon or sausage, homemade soup for lunch, and minimal dinner. He consumes one coffee  daily and drinks gin regularly. He does not monitor his blood pressure at home.    Family history: His family history includes CAD (age of onset: 69) in his paternal grandfather; CAD (age of onset: 36) in his father; Other (age of onset: 52) in his mother.   ASCVD Risk Score: The 10-year ASCVD risk score (Dylan Reyes, et al., 2019) is: 39%   Values used to calculate the score:     Age: 9 years     Clincally relevant sex: Male     Is Non-Hispanic African American: No     Diabetic: Yes     Tobacco smoker: No     Systolic Blood Pressure: 122 mmHg     Is BP treated: Yes     HDL Cholesterol: 65 mg/dL     Total Cholesterol: 311 mg/dL   Diet: Home cooked meals Loves breakfast - 2 hard boiled eggs piece of bacon or sausage Lunch - homemade soup Small dinner if at all One coffee Drinks gin daily  Activity: Academic librarian for 46 years, semi-retired working at [employer] and yard work  Research Officer, [employer] a mile at [employer] when there   No results found for: LIPOA    ROS: See HPI       Studies Reviewed: .          Risk Assessment/Calculations:             Physical Exam:   VS: BP 122/74   Pulse 75   Ht 5' 10 (1.778 m)   Wt 217 lb 6.4  oz (98.6 kg)   SpO2 99%   BMI 31.19 kg/m   Wt Readings from Last 3 Encounters:  10/01/24 217 lb 6.4 oz (98.6 kg)  04/04/24 228 lb (103.4 kg)  03/26/24 228 lb 12.8 oz (103.8 kg)     GEN: Well nourished, well developed in no acute distress NECK: No JVD; No carotid bruits CARDIAC: RRR, no murmurs, rubs, gallops RESPIRATORY:  Clear to auscultation without rales, wheezing or rhonchi  ABDOMEN: Soft, non-tender, non-distended EXTREMITIES:  No edema; No deformity     ASSESSMENT AND PLAN: .     Assessment & Plan Coronary artery calcification  Cardiac risk  Calcium score is 1295, placing him in the 88th percentile for age, with calcification in three coronary arteries. Risk factors include diabetes, fatty liver, hyperlipidemia, and a  family history of heart disease.  No history of abnormal EKG with last tracing on 04/04/2024.  Due to concerning risk factors and significant calcification, we will proceed with further ischemia evaluation. -Coronary CTA for ischemia evaluation and mapping of coronary anatomy -Lopressor 50 mg 2 hours prior to CT -Recheck lipid levels for surveillance on current lipid-lowering therapy - Check LP(a) for further risk stratification - Heart healthy diet avoiding saturated fat, processed foods, sugar, and other simple carbohydrates - Aim for at least 150 minutes of moderate intensity exercise each week - Continue aspirin , lisinopril , Repatha, simvastatin  Hyperlipidemia  LDL goal < 70 Type 2 diabetes Lipid panel completed 07/12/2024 with total cholesterol 311, HDL 65, LDL 212, triglycerides 180.  He was previously on rosuvastatin but it was discontinued due to concern for weakness.  He was started on Repatha by his endocrinologist and is tolerating it without concerning side effects. A1C 5.8% on 07/12/24.  Diabetes is managed by Dr. Tommas and he reports good control.  -Continue Repatha 140 mg every 14 days -Continue rosuvastatin 40 mg daily for now -Recheck lipid panel for surveillance  - Check lipoprotein a for further risk stratification  - Management of diabetes per Dr. Tommas - Heart healthy diet to maintain good glucose control - Aim to be as physically active as possible and for at least 150 minutes of moderate intensity exercise each week  Essential hypertension   BP is well controlled.  Renal function stable on labs completed 04/04/2024.  No change in antihypertensive therapy today. - Continue lisinopril .  Obstructive sleep apnea   Reports compliance with CPAP for 27 years.  - Encouraged continued appliance which is beneficial for cardiovascular health       Disposition: 3 months with me  Signed, Dylan Bane, NP-C

## 2024-10-09 ENCOUNTER — Ambulatory Visit (HOSPITAL_BASED_OUTPATIENT_CLINIC_OR_DEPARTMENT_OTHER): Payer: Self-pay | Admitting: Nurse Practitioner

## 2024-10-09 LAB — NMR, LIPOPROFILE
Cholesterol, Total: 184 mg/dL (ref 100–199)
HDL Particle Number: 52.4 umol/L (ref >=30.5)
HDL-C: 73 mg/dL (ref >39)
LDL Particle Number: 820 nmol/L (ref <1000)
LDL Size: 20.4 nm — ABNORMAL LOW (ref >20.5)
LDL-C (NIH Calc): 76 mg/dL (ref 0–99)
LP-IR Score: 59 — ABNORMAL HIGH (ref <=45)
Small LDL Particle Number: 376 nmol/L (ref <=527)
Triglycerides: 216 mg/dL — ABNORMAL HIGH (ref 0–149)

## 2024-10-09 LAB — LIPOPROTEIN A (LPA): Lipoprotein (a): <8.4 nmol/L (ref <75.0)

## 2024-10-17 ENCOUNTER — Encounter (HOSPITAL_COMMUNITY): Payer: Self-pay

## 2024-10-19 ENCOUNTER — Ambulatory Visit (HOSPITAL_COMMUNITY)
Admission: RE | Admit: 2024-10-19 | Discharge: 2024-10-19 | Disposition: A | Source: Ambulatory Visit | Attending: Nurse Practitioner | Admitting: Nurse Practitioner

## 2024-10-19 ENCOUNTER — Other Ambulatory Visit: Payer: Self-pay | Admitting: Cardiovascular Disease

## 2024-10-19 ENCOUNTER — Ambulatory Visit (HOSPITAL_BASED_OUTPATIENT_CLINIC_OR_DEPARTMENT_OTHER): Payer: Self-pay | Admitting: Nurse Practitioner

## 2024-10-19 ENCOUNTER — Ambulatory Visit (HOSPITAL_COMMUNITY)
Admission: RE | Admit: 2024-10-19 | Discharge: 2024-10-19 | Disposition: A | Source: Ambulatory Visit | Attending: Cardiovascular Disease | Admitting: Cardiovascular Disease

## 2024-10-19 VITALS — BP 124/71 | HR 70

## 2024-10-19 DIAGNOSIS — I2510 Atherosclerotic heart disease of native coronary artery without angina pectoris: Secondary | ICD-10-CM | POA: Diagnosis not present

## 2024-10-19 DIAGNOSIS — R931 Abnormal findings on diagnostic imaging of heart and coronary circulation: Secondary | ICD-10-CM

## 2024-10-19 DIAGNOSIS — N289 Disorder of kidney and ureter, unspecified: Secondary | ICD-10-CM

## 2024-10-19 LAB — POCT I-STAT CREATININE: Creatinine, Ser: 1.1 mg/dL (ref 0.61–1.24)

## 2024-10-19 MED ORDER — IOHEXOL 350 MG/ML SOLN
100.0000 mL | Freq: Once | INTRAVENOUS | Status: AC | PRN
  Administered 2024-10-19: 100 mL via INTRAVENOUS

## 2024-10-19 MED ORDER — NITROGLYCERIN 0.4 MG SL SUBL
0.8000 mg | SUBLINGUAL_TABLET | Freq: Once | SUBLINGUAL | Status: AC
  Administered 2024-10-19: 0.8 mg via SUBLINGUAL

## 2024-10-19 MED ORDER — ICOSAPENT ETHYL 1 G PO CAPS: 2.0000 g | ORAL_CAPSULE | Freq: Two times a day (BID) | ORAL | 3 refills | Status: AC

## 2024-10-24 ENCOUNTER — Other Ambulatory Visit (HOSPITAL_COMMUNITY): Payer: Self-pay

## 2024-10-24 ENCOUNTER — Telehealth: Payer: Self-pay | Admitting: Pharmacy Technician

## 2024-10-24 NOTE — Telephone Encounter (Signed)
 BRAND preferred by plan  [id number]

## 2024-12-31 ENCOUNTER — Ambulatory Visit (HOSPITAL_BASED_OUTPATIENT_CLINIC_OR_DEPARTMENT_OTHER): Admitting: Nurse Practitioner
